# Patient Record
Sex: Male | Born: 1975 | Race: White | Hispanic: No | Marital: Married | Smoking: Never smoker
Health system: Southern US, Community
[De-identification: ages and names within clinical notes are randomized; demographics above are authoritative.]

## PROBLEM LIST (undated history)

## (undated) DIAGNOSIS — G44009 Cluster headache syndrome, unspecified, not intractable: Secondary | ICD-10-CM

## (undated) DIAGNOSIS — N200 Calculus of kidney: Secondary | ICD-10-CM

## (undated) HISTORY — DX: Cluster headache syndrome, unspecified, not intractable: G44.009

## (undated) HISTORY — PX: SPINE SURGERY: SHX786

## (undated) HISTORY — DX: Calculus of kidney: N20.0

## (undated) HISTORY — PX: THUMB ARTHROSCOPY: SHX2509

---

## 2001-12-26 ENCOUNTER — Ambulatory Visit (HOSPITAL_COMMUNITY): Admission: RE | Admit: 2001-12-26 | Discharge: 2001-12-27 | Payer: Self-pay | Admitting: Neurosurgery

## 2002-02-27 ENCOUNTER — Encounter: Admission: RE | Admit: 2002-02-27 | Discharge: 2002-05-20 | Payer: Self-pay | Admitting: Neurosurgery

## 2011-05-22 ENCOUNTER — Ambulatory Visit (INDEPENDENT_AMBULATORY_CARE_PROVIDER_SITE_OTHER): Payer: BC Managed Care – PPO

## 2011-05-22 DIAGNOSIS — J069 Acute upper respiratory infection, unspecified: Secondary | ICD-10-CM

## 2012-09-29 ENCOUNTER — Ambulatory Visit (INDEPENDENT_AMBULATORY_CARE_PROVIDER_SITE_OTHER): Payer: BC Managed Care – PPO | Admitting: Family Medicine

## 2012-09-29 ENCOUNTER — Ambulatory Visit: Payer: BC Managed Care – PPO

## 2012-09-29 VITALS — BP 116/69 | HR 61 | Temp 98.4°F | Resp 16 | Ht 76.5 in | Wt 218.6 lb

## 2012-09-29 DIAGNOSIS — Z87442 Personal history of urinary calculi: Secondary | ICD-10-CM

## 2012-09-29 DIAGNOSIS — R1031 Right lower quadrant pain: Secondary | ICD-10-CM

## 2012-09-29 DIAGNOSIS — N201 Calculus of ureter: Secondary | ICD-10-CM

## 2012-09-29 DIAGNOSIS — R1032 Left lower quadrant pain: Secondary | ICD-10-CM

## 2012-09-29 LAB — POCT URINALYSIS DIPSTICK
Bilirubin, UA: NEGATIVE
Glucose, UA: NEGATIVE
Ketones, UA: NEGATIVE
Nitrite, UA: NEGATIVE
Protein, UA: NEGATIVE
Spec Grav, UA: 1.01
Urobilinogen, UA: 0.2
pH, UA: 7

## 2012-09-29 LAB — POCT CBC
HCT, POC: 46.7 % (ref 43.5–53.7)
Hemoglobin: 14.7 g/dL (ref 14.1–18.1)
Lymph, poc: 2.3 (ref 0.6–3.4)
MCH, POC: 29.9 pg (ref 27–31.2)
MCHC: 31.5 g/dL — AB (ref 31.8–35.4)
MCV: 95.1 fL (ref 80–97)
MID (cbc): 0.5 (ref 0–0.9)
MPV: 13.4 fL (ref 0–99.8)
POC LYMPH PERCENT: 28.4 %L (ref 10–50)
POC MID %: 5.7 %M (ref 0–12)
Platelet Count, POC: 148 10*3/uL (ref 142–424)
RBC: 4.91 M/uL (ref 4.69–6.13)
RDW, POC: 13.3 %
WBC: 8.1 10*3/uL (ref 4.6–10.2)

## 2012-09-29 LAB — POCT UA - MICROSCOPIC ONLY
Bacteria, U Microscopic: NEGATIVE
Casts, Ur, LPF, POC: NEGATIVE
Crystals, Ur, HPF, POC: NEGATIVE
Epithelial cells, urine per micros: NEGATIVE
Mucus, UA: NEGATIVE
Yeast, UA: NEGATIVE

## 2012-09-29 MED ORDER — TAMSULOSIN HCL 0.4 MG PO CAPS: 0.4000 mg | ORAL_CAPSULE | Freq: Every day | ORAL | Status: DC

## 2012-09-29 NOTE — Patient Instructions (Addendum)
Take the Flomax 1 pill each night  Return in 5 days, sooner if worse  Strain urine  Drink lots of fluids

## 2012-09-29 NOTE — Progress Notes (Signed)
Subjective: 37 year old man who has a history of kidney stones in the past. For the last month he has been having low abdominal pain. It started one night and is continued off and on over the month. Sometimes bad sometimes is mild. No nausea vomiting. He has had mushy stools but not vomit diarrhea. He has not seen any blood in his stools. No difficulty with urination. No problems with sex. He lives under a lot of chronic stress. This is mostly from his job. He has not been having any fever. The symptoms have just continued to persist, and his mind is rolling on them, so he came on in.  Objective: Chest is clear. Heart regular without murmurs. Abdomen has normal bowel sounds, soft without masses. He has some suprapubic tenderness. No CVA tenderness.  Assessment: Nonspecific low abdominal pain History of kidney stones  Plan: CBC, UA, KUB  [facility] reading (PRIMARY) by  Dr. Alwyn Ren Possible right upj stone.    Results for orders placed in visit on 09/29/12  POCT CBC      Result Value Range   WBC 8.1  4.6 - 10.2 K/uL   Lymph, poc 2.3  0.6 - 3.4   POC LYMPH PERCENT 28.4  10 - 50 %L   MID (cbc) 0.5  0 - 0.9   POC MID % 5.7  0 - 12 %M   POC Granulocyte 5.3  2 - 6.9   Granulocyte percent 65.9  37 - 80 %G   RBC 4.91  4.69 - 6.13 M/uL   Hemoglobin 14.7  14.1 - 18.1 g/dL   HCT, POC 16.1  09.6 - 53.7 %   MCV 95.1  80 - 97 fL   MCH, POC 29.9  27 - 31.2 pg   MCHC 31.5 (*) 31.8 - 35.4 g/dL   RDW, POC 04.5     Platelet Count, POC 148  142 - 424 K/uL   MPV 13.4  0 - 99.8 fL  POCT UA - MICROSCOPIC ONLY      Result Value Range   WBC, Ur, HPF, POC 0-2     RBC, urine, microscopic 7-10     Bacteria, U Microscopic neg     Mucus, UA neg     Epithelial cells, urine per micros neg     Crystals, Ur, HPF, POC neg     Casts, Ur, LPF, POC neg     Yeast, UA neg    POCT URINALYSIS DIPSTICK      Result Value Range   Color, UA yellow     Clarity, UA clear     Glucose, UA neg     Bilirubin, UA neg     Ketones, UA neg     Spec Grav, UA 1.010     Blood, UA large     pH, UA 7.0     Protein, UA neg     Urobilinogen, UA 0.2     Nitrite, UA neg     Leukocytes, UA Trace     Assessment: Possible ureterolithiasis Low abdominal pain  Plan: Get radiology reading  Diagnosis: Possible bilateral ureterolithiasis  Plan: Flomax Return in 5 days

## 2016-10-13 ENCOUNTER — Telehealth: Payer: Self-pay | Admitting: Cardiology

## 2016-10-13 ENCOUNTER — Ambulatory Visit (INDEPENDENT_AMBULATORY_CARE_PROVIDER_SITE_OTHER): Payer: Managed Care, Other (non HMO) | Admitting: Cardiology

## 2016-10-13 ENCOUNTER — Ambulatory Visit (INDEPENDENT_AMBULATORY_CARE_PROVIDER_SITE_OTHER): Payer: Managed Care, Other (non HMO) | Admitting: Emergency Medicine

## 2016-10-13 ENCOUNTER — Encounter: Payer: Self-pay | Admitting: Emergency Medicine

## 2016-10-13 ENCOUNTER — Encounter: Payer: Self-pay | Admitting: Cardiology

## 2016-10-13 VITALS — BP 126/74 | HR 56 | Temp 97.8°F | Resp 18 | Ht 76.0 in | Wt 222.6 lb

## 2016-10-13 VITALS — BP 110/60 | HR 137 | Ht 76.0 in | Wt 226.0 lb

## 2016-10-13 DIAGNOSIS — I483 Typical atrial flutter: Secondary | ICD-10-CM

## 2016-10-13 DIAGNOSIS — R002 Palpitations: Secondary | ICD-10-CM | POA: Diagnosis not present

## 2016-10-13 DIAGNOSIS — I4891 Unspecified atrial fibrillation: Secondary | ICD-10-CM | POA: Diagnosis not present

## 2016-10-13 DIAGNOSIS — I4819 Other persistent atrial fibrillation: Secondary | ICD-10-CM | POA: Insufficient documentation

## 2016-10-13 DIAGNOSIS — I480 Paroxysmal atrial fibrillation: Secondary | ICD-10-CM | POA: Diagnosis not present

## 2016-10-13 DIAGNOSIS — Z0001 Encounter for general adult medical examination with abnormal findings: Secondary | ICD-10-CM | POA: Diagnosis not present

## 2016-10-13 NOTE — Patient Instructions (Addendum)
GO TO CARDIOLOGY DR [place] TODAY 10/13/16 415PM [address] BRING MEDS AND INSURANCE    IF you received an x-ray today, you will receive an invoice from [facility] Radiology. Please contact [facility] at [phone] with questions or concerns regarding your invoice.   IF you received labwork today, you will receive an invoice from [facility]. Please contact [facility] at [phone] with questions or concerns regarding your invoice.   Our billing staff will not be able to assist you with questions regarding bills from these companies.  You will be contacted with the lab results as soon as they are available. The fastest way to get your results is to activate your My Chart account. Instructions are located on the last page of this paperwork. If you have not heard from [place] regarding the results in 2 weeks, please contact this office.         Health Maintenance, Male A healthy lifestyle and preventive care is important for your health and wellness. Ask your health care provider about what schedule of regular examinations is right for you. What should I know about weight and diet?  Eat a Healthy Diet  Eat plenty of vegetables, fruits, whole grains, low-fat dairy products, and lean protein.  Do not eat a lot of foods high in solid fats, added sugars, or salt. Maintain a Healthy Weight  Regular exercise can help you achieve or maintain a healthy weight. You should:  Do at least 150 minutes of exercise each week. The exercise should increase your heart rate and make you sweat (moderate-intensity exercise).  Do strength-training exercises at least twice a week. Watch Your Levels of Cholesterol and Blood Lipids  Have your blood tested for lipids and cholesterol every 5 years starting at 41 years of age. If you are at high risk for heart disease, you should start having your blood tested when you are 41 years old. You may need to have your cholesterol levels checked more  often if:  Your lipid or cholesterol levels are high.  You are older than 41 years of age.  You are at high risk for heart disease. What should I know about cancer screening? Many types of cancers can be detected early and may often be prevented. Lung Cancer  You should be screened every year for lung cancer if:  You are a current smoker who has smoked for at least 30 years.  You are a former smoker who has quit within the past 15 years.  Talk to your health care provider about your screening options, when you should start screening, and how often you should be screened. Colorectal Cancer  Routine colorectal cancer screening usually begins at 41 years of age and should be repeated every 5-10 years until you are 41 years old. You may need to be screened more often if early forms of precancerous polyps or small growths are found. Your health care provider may recommend screening at an earlier age if you have risk factors for colon cancer.  Your health care provider may recommend using home test kits to check for hidden blood in the stool.  A small camera at the end of a tube can be used to examine your colon (sigmoidoscopy or colonoscopy). This checks for the earliest forms of colorectal cancer. Prostate and Testicular Cancer  Depending on your age and overall health, your health care provider may do certain tests to screen for prostate and testicular cancer.  Talk to your health care provider about any symptoms  or concerns you have about testicular or prostate cancer. Skin Cancer  Check your skin from head to toe regularly.  Tell your health care provider about any new moles or changes in moles, especially if:  There is a change in a mole's size, shape, or color.  You have a mole that is larger than a pencil eraser.  Always use sunscreen. Apply sunscreen liberally and repeat throughout the day.  Protect yourself by wearing long sleeves, pants, a wide-brimmed hat, and  sunglasses when outside. What should I know about heart disease, diabetes, and high blood pressure?  If you are 5918-41 years of age, have your blood pressure checked every 3-5 years. If you are 41 years of age or older, have your blood pressure checked every year. You should have your blood pressure measured twice-once when you are at a hospital or clinic, and once when you are not at a hospital or clinic. Record the average of the two measurements. To check your blood pressure when you are not at a hospital or clinic, you can use:  An automated blood pressure machine at a pharmacy.  A home blood pressure monitor.  Talk to your health care provider about your target blood pressure.  If you are between 2245-41 years old, ask your health care provider if you should take aspirin to prevent heart disease.  Have regular diabetes screenings by checking your fasting blood sugar level.  If you are at a normal weight and have a low risk for diabetes, have this test once every three years after the age of 41.  If you are overweight and have a high risk for diabetes, consider being tested at a younger age or more often.  A one-time screening for abdominal aortic aneurysm (AAA) by ultrasound is recommended for men aged 65-75 years who are current or former smokers. What should I know about preventing infection? Hepatitis B  If you have a higher risk for hepatitis B, you should be screened for this virus. Talk with your health care provider to find out if you are at risk for hepatitis B infection. Hepatitis C  Blood testing is recommended for:  Everyone born from [id number] through 1965.  Anyone with known risk factors for hepatitis C. Sexually Transmitted Diseases (STDs)  You should be screened each year for STDs including gonorrhea and chlamydia if:  You are sexually active and are younger than 41 years of age.  You are older than 41 years of age and your health care provider tells you that you are at  risk for this type of infection.  Your sexual activity has changed since you were last screened and you are at an increased risk for chlamydia or gonorrhea. Ask your health care provider if you are at risk.  Talk with your health care provider about whether you are at high risk of being infected with HIV. Your health care provider may recommend a prescription medicine to help prevent HIV infection. What else can I do?  Schedule regular health, dental, and eye exams.  Stay current with your vaccines (immunizations).  Do not use any tobacco products, such as cigarettes, chewing tobacco, and e-cigarettes. If you need help quitting, ask your health care provider.  Limit alcohol intake to no more than 2 drinks per day. One drink equals 12 ounces of beer, 5 ounces of wine, or 1 ounces of hard liquor.  Do not use street drugs.  Do not share needles.  Ask your health care provider for help  if you need support or information about quitting drugs.  Tell your health care provider if you often feel depressed.  Tell your health care provider if you have ever been abused or do not feel safe at home. This information is not intended to replace advice given to you by your health care provider. Make sure you discuss any questions you have with your health care provider. Document Released: 11/05/2007 Document Revised: 01/06/2016 Document Reviewed: 02/10/2015 Elsevier Interactive Patient Education  2017 [employer]  American Heart Association [facility]) Exercise Recommendation  Being physically active is important to prevent heart disease and stroke, the nation's No. 1and No. 5killers. To improve overall cardiovascular health, we suggest at least 150 minutes per week of moderate exercise or 75 minutes per week of vigorous exercise (or a combination of moderate and vigorous activity). Thirty minutes a day, five times a week is an easy goal to remember. You will also experience benefits even if you divide  your time into two or three segments of 10 to 15 minutes per day.  For people who would benefit from lowering their blood pressure or cholesterol, we recommend 40 minutes of aerobic exercise of moderate to vigorous intensity three to four times a week to lower the risk for heart attack and stroke.  Physical activity is anything that makes you move your body and burn calories.  This includes things like climbing stairs or playing sports. Aerobic exercises benefit your heart, and include walking, jogging, swimming or biking. Strength and stretching exercises are best for overall stamina and flexibility.  The simplest, positive change you can make to effectively improve your heart health is to start walking. It's enjoyable, free, easy, social and great exercise. A walking program is flexible and boasts high success rates because people can stick with it. It's easy for walking to become a regular and satisfying part of life.   For Overall Cardiovascular Health:  At least 30 minutes of moderate-intensity aerobic activity at least 5 days per week for a total of 150  OR   At least 25 minutes of vigorous aerobic activity at least 3 days per week for a total of 75 minutes; or a combination of moderate- and vigorous-intensity aerobic activity  AND   Moderate- to high-intensity muscle-strengthening activity at least 2 days per week for additional health benefits.  For Lowering Blood Pressure and Cholesterol  An average 40 minutes of moderate- to vigorous-intensity aerobic activity 3 or 4 times per week  What if I can't make it to the time goal? Something is always better than nothing! And everyone has to start somewhere. Even if you've been sedentary for years, today is the day you can begin to make healthy changes in your life. If you don't think you'll make it for 30 or 40 minutes, set a reachable goal for today. You can work up toward your overall goal by increasing your time as you get  stronger. Don't let all-or-nothing thinking rob you of doing what you can every day.  Source:http://www.heart.org    Atrial Fibrillation Atrial fibrillation is a type of heartbeat that is irregular or fast (rapid). If you have this condition, your heart keeps quivering in a weird (chaotic) way. This condition can make it so your heart cannot pump blood normally. Having this condition gives a person more risk for stroke, heart failure, and other heart problems. There are different types of atrial fibrillation. Talk with your doctor to learn about the type that you have. Follow these instructions  at home:  Take over-the-counter and prescription medicines only as told by your doctor.  If your doctor prescribed a blood-thinning medicine, take it exactly as told. Taking too much of it can cause bleeding. If you do not take enough of it, you will not have the protection that you need against stroke and other problems.  Do not use any tobacco products. These include cigarettes, chewing tobacco, and e-cigarettes. If you need help quitting, ask your doctor.  If you have apnea (obstructive sleep apnea), manage it as told by your doctor.  Do not drink alcohol.  Do not drink beverages that have caffeine. These include coffee, soda, and tea.  Maintain a healthy weight. Do not use diet pills unless your doctor says they are safe for you. Diet pills may make heart problems worse.  Follow diet instructions as told by your doctor.  Exercise regularly as told by your doctor.  Keep all follow-up visits as told by your doctor. This is important. Contact a doctor if:  You notice a change in the speed, rhythm, or strength of your heartbeat.  You are taking a blood-thinning medicine and you notice more bruising.  You get tired more easily when you move or exercise. Get help right away if:  You have pain in your chest or your belly (abdomen).  You have sweating or weakness.  You feel sick to your  stomach (nauseous).  You notice blood in your throw up (vomit), poop (stool), or pee (urine).  You are short of breath.  You suddenly have swollen feet and ankles.  You feel dizzy.  Your suddenly get weak or numb in your face, arms, or legs, especially if it happens on one side of your body.  You have trouble talking, trouble understanding, or both.  Your face or your eyelid droops on one side. These symptoms may be an emergency. Do not wait to see if the symptoms will go away. Get medical help right away. Call your local emergency services (911 in the [place] Do not drive yourself to the hospital. This information is not intended to replace advice given to you by your health care provider. Make sure you discuss any questions you have with your health care provider. Document Released: 02/16/2008 Document Revised: 10/15/2015 Document Reviewed: 09/03/2014 Elsevier Interactive Patient Education  2017 [employer].

## 2016-10-13 NOTE — Telephone Encounter (Signed)
Received a call from Clydie BraunKaren at Dr.Sagardia's office wanting to refer patient today to see Dr.Jordan for new onset Afib.Appointment scheduled with Dr.Jordan today at 4:40 pm.

## 2016-10-13 NOTE — Progress Notes (Signed)
David Hays 41 y.o.   Chief Complaint  Patient presents with  . Annual Exam    HISTORY OF PRESENT ILLNESS: This is a 41 y.o. male here for annual exam; has no complaints or medical concerns.  HPI   Prior to Admission medications   Medication Sig Start Date End Date Taking? Authorizing Provider  doxycycline (VIBRAMYCIN) 100 MG capsule Take 100 mg by mouth 2 (two) times daily.   Yes [provider]  MetroNIDAZOLE-Cleanser 0.75 % (Cream) KIT Apply topically.   Yes [provider]  tamsulosin (FLOMAX) 0.4 MG CAPS Take 1 capsule (0.4 mg total) by mouth daily after supper. Patient not taking: Reported on 10/13/2016 09/29/12   [name], MD    No Known Allergies  There are no active problems to display for this patient.   No past medical history on file.  Past Surgical History:  Procedure Laterality Date  . SPINE SURGERY      Social History   Social History  . Marital status: Single    Spouse name: N/A  . Number of children: N/A  . Years of education: N/A   Occupational History  . Not on file.   Social History Main Topics  . Smoking status: Never Smoker  . Smokeless tobacco: Never Used  . Alcohol use Yes  . Drug use: No  . Sexual activity: Yes   Other Topics Concern  . Not on file   Social History Narrative  . No narrative on file    No family history on file.   Review of Systems  Constitutional: Negative.  Negative for chills, fever and weight loss.  HENT: Negative.  Negative for congestion, hearing loss, nosebleeds and sore throat.   Eyes: Negative.  Negative for blurred vision, double vision and pain.  Respiratory: Negative for cough and shortness of breath.   Cardiovascular: Positive for palpitations. Negative for chest pain, orthopnea, leg swelling and PND.  Gastrointestinal: Negative for abdominal pain, diarrhea, nausea and vomiting.  Genitourinary: Negative for dysuria and hematuria.  Musculoskeletal: Negative for back  pain, myalgias and neck pain.  Skin: Negative for rash.  Neurological: Negative for dizziness, sensory change, speech change, focal weakness and headaches.  Endo/Heme/Allergies: Negative.   All other systems reviewed and are negative.  Vitals:   10/13/16 0934  BP: 126/74  Pulse: (!) 56  Resp: 18  Temp: 97.8 F (36.6 C)     Physical Exam  Constitutional: He is oriented to person, place, and time. He appears well-developed and well-nourished.  HENT:  Head: Normocephalic and atraumatic.  Eyes: Conjunctivae and EOM are normal. Pupils are equal, round, and reactive to light.  Neck: Normal range of motion. Neck supple.  Cardiovascular: An irregularly irregular rhythm present.  No murmur heard. Pulmonary/Chest: Effort normal and breath sounds normal.  Abdominal: Soft. Bowel sounds are normal. He exhibits no distension. There is no tenderness.  Musculoskeletal: Normal range of motion.  Neurological: He is alert and oriented to person, place, and time. No sensory deficit. He exhibits normal muscle tone.  Skin: Skin is warm and dry. Capillary refill takes less than 2 seconds. No rash noted.  Psychiatric: He has a normal mood and affect. His behavior is normal.  Vitals reviewed.  EKG: Atrial fibrillation with VR 80-100 No h/o prior A fib. Cardiology consult scheduled for today 415pm.  ASSESSMENT & PLAN: David Hays was seen today for annual exam.  Diagnoses and all orders for this visit:  New onset atrial fibrillation ([place])  Palpitations -  EKG 12-Lead  Encounter for general adult medical examination with abnormal findings -     CBC with Differential -     Comprehensive metabolic panel -     Hemoglobin A1c -     Lipid panel -     TSH    Patient Instructions   GO TO CARDIOLOGY DR [place] TODAY 10/13/16 415PM 9 [place] [address] BRING MEDS AND INSURANCE    IF you received an x-ray today, you will receive an invoice from [facility]. Please contact  [facility] at [phone] with questions or concerns regarding your invoice.   IF you received labwork today, you will receive an invoice from [facility]. Please contact [facility] at [phone] with questions or concerns regarding your invoice.   Our billing staff will not be able to assist you with questions regarding bills from these companies.  You will be contacted with the lab results as soon as they are available. The fastest way to get your results is to activate your My Chart account. Instructions are located on the last page of this paperwork. If you have not heard from [place] regarding the results in 2 weeks, please contact this office.         Health Maintenance, Male A healthy lifestyle and preventive care is important for your health and wellness. Ask your health care provider about what schedule of regular examinations is right for you. What should I know about weight and diet?  Eat a Healthy Diet  Eat plenty of vegetables, fruits, whole grains, low-fat dairy products, and lean protein.  Do not eat a lot of foods high in solid fats, added sugars, or salt. Maintain a Healthy Weight  Regular exercise can help you achieve or maintain a healthy weight. You should:  Do at least 150 minutes of exercise each week. The exercise should increase your heart rate and make you sweat (moderate-intensity exercise).  Do strength-training exercises at least twice a week. Watch Your Levels of Cholesterol and Blood Lipids  Have your blood tested for lipids and cholesterol every 5 years starting at 41 years of age. If you are at high risk for heart disease, you should start having your blood tested when you are 41 years old. You may need to have your cholesterol levels checked more often if:  Your lipid or cholesterol levels are high.  You are older than 41 years of age.  You are at high risk for heart disease. What should I know about cancer screening? Many types of cancers can  be detected early and may often be prevented. Lung Cancer  You should be screened every year for lung cancer if:  You are a current smoker who has smoked for at least 30 years.  You are a former smoker who has quit within the past 15 years.  Talk to your health care provider about your screening options, when you should start screening, and how often you should be screened. Colorectal Cancer  Routine colorectal cancer screening usually begins at 41 years of age and should be repeated every 5-10 years until you are 41 years old. You may need to be screened more often if early forms of precancerous polyps or small growths are found. Your health care provider may recommend screening at an earlier age if you have risk factors for colon cancer.  Your health care provider may recommend using home test kits to check for hidden blood in the stool.  A small camera at the end of a tube  can be used to examine your colon (sigmoidoscopy or colonoscopy). This checks for the earliest forms of colorectal cancer. Prostate and Testicular Cancer  Depending on your age and overall health, your health care provider may do certain tests to screen for prostate and testicular cancer.  Talk to your health care provider about any symptoms or concerns you have about testicular or prostate cancer. Skin Cancer  Check your skin from head to toe regularly.  Tell your health care provider about any new moles or changes in moles, especially if:  There is a change in a mole's size, shape, or color.  You have a mole that is larger than a pencil eraser.  Always use sunscreen. Apply sunscreen liberally and repeat throughout the day.  Protect yourself by wearing long sleeves, pants, a wide-brimmed hat, and sunglasses when outside. What should I know about heart disease, diabetes, and high blood pressure?  If you are 29-39 years of age, have your blood pressure checked every 3-5 years. If you are 95 years of age or  older, have your blood pressure checked every year. You should have your blood pressure measured twice-once when you are at a hospital or clinic, and once when you are not at a hospital or clinic. Record the average of the two measurements. To check your blood pressure when you are not at a hospital or clinic, you can use:  An automated blood pressure machine at a pharmacy.  A home blood pressure monitor.  Talk to your health care provider about your target blood pressure.  If you are between 59-19 years old, ask your health care provider if you should take aspirin to prevent heart disease.  Have regular diabetes screenings by checking your fasting blood sugar level.  If you are at a normal weight and have a low risk for diabetes, have this test once every three years after the age of 29.  If you are overweight and have a high risk for diabetes, consider being tested at a younger age or more often.  A one-time screening for abdominal aortic aneurysm (AAA) by ultrasound is recommended for men aged 11-75 years who are current or former smokers. What should I know about preventing infection? Hepatitis B  If you have a higher risk for hepatitis B, you should be screened for this virus. Talk with your health care provider to find out if you are at risk for hepatitis B infection. Hepatitis C  Blood testing is recommended for:  Everyone born from [birth year] through 1965.  Anyone with known risk factors for hepatitis C. Sexually Transmitted Diseases (STDs)  You should be screened each year for STDs including gonorrhea and chlamydia if:  You are sexually active and are younger than 41 years of age.  You are older than 41 years of age and your health care provider tells you that you are at risk for this type of infection.  Your sexual activity has changed since you were last screened and you are at an increased risk for chlamydia or gonorrhea. Ask your health care provider if you are at  risk.  Talk with your health care provider about whether you are at high risk of being infected with HIV. Your health care provider may recommend a prescription medicine to help prevent HIV infection. What else can I do?  Schedule regular health, dental, and eye exams.  Stay current with your vaccines (immunizations).  Do not use any tobacco products, such as cigarettes, chewing tobacco, and e-cigarettes. If you  need help quitting, ask your health care provider.  Limit alcohol intake to no more than 2 drinks per day. One drink equals 12 ounces of beer, 5 ounces of wine, or 1 ounces of hard liquor.  Do not use street drugs.  Do not share needles.  Ask your health care provider for help if you need support or information about quitting drugs.  Tell your health care provider if you often feel depressed.  Tell your health care provider if you have ever been abused or do not feel safe at home. This information is not intended to replace advice given to you by your health care provider. Make sure you discuss any questions you have with your health care provider. Document Released: 11/05/2007 Document Revised: 01/06/2016 Document Reviewed: 02/10/2015 Elsevier Interactive Patient Education  2017 [facility]) Exercise Recommendation  Being physically active is important to prevent heart disease and stroke, the nation's No. 1and No. 5killers. To improve overall cardiovascular health, we suggest at least 150 minutes per week of moderate exercise or 75 minutes per week of vigorous exercise (or a combination of moderate and vigorous activity). Thirty minutes a day, five times a week is an easy goal to remember. You will also experience benefits even if you divide your time into two or three segments of 10 to 15 minutes per day.  For people who would benefit from lowering their blood pressure or cholesterol, we recommend 40 minutes of aerobic exercise of moderate  to vigorous intensity three to four times a week to lower the risk for heart attack and stroke.  Physical activity is anything that makes you move your body and burn calories.  This includes things like climbing stairs or playing sports. Aerobic exercises benefit your heart, and include walking, jogging, swimming or biking. Strength and stretching exercises are best for overall stamina and flexibility.  The simplest, positive change you can make to effectively improve your heart health is to start walking. It's enjoyable, free, easy, social and great exercise. A walking program is flexible and boasts high success rates because people can stick with it. It's easy for walking to become a regular and satisfying part of life.   For Overall Cardiovascular Health:  At least 30 minutes of moderate-intensity aerobic activity at least 5 days per week for a total of 150  OR   At least 25 minutes of vigorous aerobic activity at least 3 days per week for a total of 75 minutes; or a combination of moderate- and vigorous-intensity aerobic activity  AND   Moderate- to high-intensity muscle-strengthening activity at least 2 days per week for additional health benefits.  For Lowering Blood Pressure and Cholesterol  An average 40 minutes of moderate- to vigorous-intensity aerobic activity 3 or 4 times per week  What if I can't make it to the time goal? Something is always better than nothing! And everyone has to start somewhere. Even if you've been sedentary for years, today is the day you can begin to make healthy changes in your life. If you don't think you'll make it for 30 or 40 minutes, set a reachable goal for today. You can work up toward your overall goal by increasing your time as you get stronger. Don't let all-or-nothing thinking rob you of doing what you can every day.  Source:http://www.heart.org    Atrial Fibrillation Atrial fibrillation is a type of heartbeat that is irregular or fast  (rapid). If you have this condition, your heart keeps  quivering in a weird (chaotic) way. This condition can make it so your heart cannot pump blood normally. Having this condition gives a person more risk for stroke, heart failure, and other heart problems. There are different types of atrial fibrillation. Talk with your doctor to learn about the type that you have. Follow these instructions at home:  Take over-the-counter and prescription medicines only as told by your doctor.  If your doctor prescribed a blood-thinning medicine, take it exactly as told. Taking too much of it can cause bleeding. If you do not take enough of it, you will not have the protection that you need against stroke and other problems.  Do not use any tobacco products. These include cigarettes, chewing tobacco, and e-cigarettes. If you need help quitting, ask your doctor.  If you have apnea (obstructive sleep apnea), manage it as told by your doctor.  Do not drink alcohol.  Do not drink beverages that have caffeine. These include coffee, soda, and tea.  Maintain a healthy weight. Do not use diet pills unless your doctor says they are safe for you. Diet pills may make heart problems worse.  Follow diet instructions as told by your doctor.  Exercise regularly as told by your doctor.  Keep all follow-up visits as told by your doctor. This is important. Contact a doctor if:  You notice a change in the speed, rhythm, or strength of your heartbeat.  You are taking a blood-thinning medicine and you notice more bruising.  You get tired more easily when you move or exercise. Get help right away if:  You have pain in your chest or your belly (abdomen).  You have sweating or weakness.  You feel sick to your stomach (nauseous).  You notice blood in your throw up (vomit), poop (stool), or pee (urine).  You are short of breath.  You suddenly have swollen feet and ankles.  You feel dizzy.  Your suddenly get weak  or numb in your face, arms, or legs, especially if it happens on one side of your body.  You have trouble talking, trouble understanding, or both.  Your face or your eyelid droops on one side. These symptoms may be an emergency. Do not wait to see if the symptoms will go away. Get medical help right away. Call your local emergency services (911 in the [place] Do not drive yourself to the hospital. This information is not intended to replace advice given to you by your health care provider. Make sure you discuss any questions you have with your health care provider. Document Released: 02/16/2008 Document Revised: 10/15/2015 Document Reviewed: 09/03/2014 Elsevier Interactive Patient Education  2017 Elsevier Inc.      Agustina Caroli, MD [facility]

## 2016-10-13 NOTE — Progress Notes (Signed)
Cardiology Office Note   Date:  10/13/2016   ID:  David Hays, DOB [birth year], MRN [id number]  PCP:  David Quint, MD  Cardiologist:   David Swaziland, MD   Chief Complaint  Patient presents with  . Atrial Fibrillation      History of Present Illness: David Hays is a 41 y.o. male seen at the request of Dr. Alvy Hays for evaluation of new onset Atrial fibrillation. The patient reports that he just went for a routine physical today and was found to have atrial fibrillation. Rate was 85 bpm. He was not aware of arrhythmia. In retrospect may have noted a little pounding in his heart at rest. No tachycardia, chest pain, SOB, dizziness, or syncope. Denies history of HTN, DM, HLD. States he was put on Tenormin as 41 year old for murmur but thinks he only took it for one year. 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 Finds little times to care for himself or exercise. Has trouble sleeping and will sometimes get up at 3 am because his mind is racing. Drinks a moderate amount of caffeine. Rarely drinks Etoh. No smoking or recreational drugs. No decongestants.     Past Medical History:  Diagnosis Date  . Headache, cluster   . Kidney stone     Past Surgical History:  Procedure Laterality Date  . SPINE SURGERY    . THUMB ARTHROSCOPY       Current Outpatient Prescriptions  Medication Sig Dispense Refill  . doxycycline (VIBRAMYCIN) 100 MG capsule Take 100 mg by mouth 2 (two) times daily.    . metroNIDAZOLE (METROCREAM) 0.75 % cream Apply 1 application topically 2 (two) times daily.     No current facility-administered medications for this visit.     Allergies:   Patient has no known allergies.    Social History:  The patient  reports that he has never smoked. He has never used smokeless tobacco. He reports that he drinks alcohol. He reports that he does not use drugs.   Family History:  The patient's family history includes Arrhythmia in his mother;  Hyperlipidemia in his father.    ROS:  Please see the history of present illness.   Otherwise, review of systems are positive for none.   All other systems are reviewed and negative.    PHYSICAL EXAM: VS:  BP 110/60   Pulse (!) 137   Ht 6\' 4"  (1.93 m)   Wt 226 lb (102.5 kg)   BMI 27.51 kg/m  , BMI Body mass index is 27.51 kg/m. GEN: Well nourished, well developed, in no acute distress  HEENT: normal  Neck: no JVD, carotid bruits, or masses Cardiac: RRR; no murmurs, rubs, or gallops,no edema  Respiratory:  clear to auscultation bilaterally, normal work of breathing GI: soft, nontender, nondistended, + BS MS: no deformity or atrophy  Skin: warm and dry, no rash Neuro:  Strength and sensation are intact Psych: euthymic mood, full affect   EKG:  EKG is ordered today. The ekg ordered today at 10:08 am demonstrates Atrial fibrillation with rate 85. Incomplete RBBB.   Repeat Ecg in our office shows atrial flutter with rate 137 bpm. Nonspecific ST abnormality.   Repeat Ecg after exam showed NSR with rate 59 bpm. Normal Ecg.   I have personally reviewed and interpreted these studies.    Recent Labs: No results found for requested labs within last 8760 hours.    Lipid Panel No results found  for: CHOL, TRIG, HDL, CHOLHDL, VLDL, LDLCALC, LDLDIRECT    Wt Readings from Last 3 Encounters:  10/13/16 226 lb (102.5 kg)  10/13/16 222 lb 9.6 oz (101 kg)  09/29/12 218 lb 9.6 oz (99.2 kg)      Other studies Reviewed: Additional studies/ records that were reviewed today include: none   ASSESSMENT AND PLAN:  1.  Paroxysmal Atrial fibrillation and flutter. Asymptomatic. In and out of rhythm on exam. No clear triggers but caffeine and lifestyle may be playing a role. Appropriate lab work drawn earlier today by Dr. Alvy BimlerSagardia. Will schedule for an Echocardiogram. Recommend avoidance of stimulants such as caffeine or decongestants. Try and improve on a heart healthy lifestyle with  reduced stress, increased aerobic activity and plenty of rest.  Since he is asymptomatic and rhythm is not sustained I don't think he needs AV nodal blockade or antiarrhythmic drug therapy at this point.   Current medicines are reviewed at length with the patient today.  The patient does not have concerns regarding medicines.  The following changes have been made:  no change  Labs/ tests ordered today include: Echo  Orders Placed This Encounter  Procedures  . EKG 12-Lead  . EKG 12-Lead  . ECHOCARDIOGRAM COMPLETE     Disposition:   FU with me in 4 weeks  Signed, David SwazilandJordan, MD  10/13/2016 5:59 PM    [facility] [address], [place], [place], [id number] Phone [phone], Fax [phone]

## 2016-10-13 NOTE — Patient Instructions (Signed)
We will schedule you for an Echocardiogram  Avoid cardiac stimulants like caffeine and decongestants.  I will see you back after the above study

## 2016-10-14 ENCOUNTER — Encounter: Payer: Self-pay | Admitting: Radiology

## 2016-10-14 LAB — COMPREHENSIVE METABOLIC PANEL
ALBUMIN: 4.6 g/dL (ref 3.5–5.5)
ALK PHOS: 63 IU/L (ref 39–117)
ALT: 17 IU/L (ref 0–44)
AST: 18 IU/L (ref 0–40)
Albumin/Globulin Ratio: 1.6 (ref 1.2–2.2)
BUN / CREAT RATIO: 14 (ref 9–20)
BUN: 10 mg/dL (ref 6–24)
Bilirubin Total: 0.5 mg/dL (ref 0.0–1.2)
CHLORIDE: 102 mmol/L (ref 96–106)
CO2: 24 mmol/L (ref 18–29)
CREATININE: 0.7 mg/dL — AB (ref 0.76–1.27)
Calcium: 9.4 mg/dL (ref 8.7–10.2)
GFR calc Af Amer: 136 mL/min/{1.73_m2} (ref >59)
GFR calc non Af Amer: 118 mL/min/{1.73_m2} (ref >59)
GLUCOSE: 88 mg/dL (ref 65–99)
Globulin, Total: 2.8 g/dL (ref 1.5–4.5)
Potassium: 4.4 mmol/L (ref 3.5–5.2)
Sodium: 142 mmol/L (ref 134–144)
TOTAL PROTEIN: 7.4 g/dL (ref 6.0–8.5)

## 2016-10-14 LAB — CBC WITH DIFFERENTIAL/PLATELET
Basophils Absolute: 0 10*3/uL (ref 0.0–0.2)
Basos: 0 %
EOS (ABSOLUTE): 0.1 10*3/uL (ref 0.0–0.4)
Eos: 1 %
HEMOGLOBIN: 14.2 g/dL (ref 13.0–17.7)
Hematocrit: 43.2 % (ref 37.5–51.0)
IMMATURE GRANS (ABS): 0 10*3/uL (ref 0.0–0.1)
Immature Granulocytes: 0 %
LYMPHS: 34 %
Lymphocytes Absolute: 2.2 10*3/uL (ref 0.7–3.1)
MCH: 29.2 pg (ref 26.6–33.0)
MCHC: 32.9 g/dL (ref 31.5–35.7)
MCV: 89 fL (ref 79–97)
MONOCYTES: 7 %
Monocytes Absolute: 0.5 10*3/uL (ref 0.1–0.9)
Neutrophils Absolute: 3.6 10*3/uL (ref 1.4–7.0)
Neutrophils: 58 %
PLATELETS: 163 10*3/uL (ref 150–379)
RBC: 4.87 x10E6/uL (ref 4.14–5.80)
RDW: 13.7 % (ref 12.3–15.4)
WBC: 6.3 10*3/uL (ref 3.4–10.8)

## 2016-10-14 LAB — LIPID PANEL
CHOLESTEROL TOTAL: 155 mg/dL (ref 100–199)
Chol/HDL Ratio: 3.8 ratio (ref 0.0–5.0)
HDL: 41 mg/dL (ref >39)
LDL Calculated: 105 mg/dL — ABNORMAL HIGH (ref 0–99)
TRIGLYCERIDES: 44 mg/dL (ref 0–149)
VLDL Cholesterol Cal: 9 mg/dL (ref 5–40)

## 2016-10-14 LAB — HEMOGLOBIN A1C
Est. average glucose Bld gHb Est-mCnc: 100 mg/dL
HEMOGLOBIN A1C: 5.1 % (ref 4.8–5.6)

## 2016-10-14 LAB — TSH: TSH: 1.31 u[IU]/mL (ref 0.450–4.500)

## 2016-10-27 ENCOUNTER — Ambulatory Visit (HOSPITAL_COMMUNITY): Payer: Managed Care, Other (non HMO) | Attending: Cardiology

## 2016-10-27 ENCOUNTER — Other Ambulatory Visit: Payer: Self-pay

## 2016-10-27 DIAGNOSIS — I480 Paroxysmal atrial fibrillation: Secondary | ICD-10-CM | POA: Diagnosis not present

## 2016-11-08 NOTE — Progress Notes (Signed)
Cardiology Office Note   Date:  11/10/2016   ID:  David Hays, DOB Feb 21, 1976, MRN [id number]  PCP:  David Hays, David Jose, MD  Cardiologist:   David Sayed Swazil[place], MD   Chief Complaint  Patient presents with  . Follow-up    F/U after echo. No complaints.      History of Present Illness: David JamesKenneth J Hays is a 41 y.o. male seen for follow up  Atrial fibrillation. He was found on routine physical to  have atrial fibrillation. Rate was 85 bpm. When we saw for initial evaluation he was found to have Atrial flutter with rate 137, then Afib with rate 85, then converted to NSR with rate in 60s.He was not aware of arrhythmia. In retrospect may have noted a little pounding in his heart at rest. No tachycardia, chest pain, SOB, dizziness, or syncope. Denies history of HTN, DM, HLD.  Since his diagnosis he has changed his lifestyle and is exercising daily. Avoiding caffeine. Eating much less sugar. Feels very well.  Rarely drinks Etoh. No smoking or recreational drugs. No decongestants.    Past Medical History:  Diagnosis Date  . Headache, cluster   . Kidney stone     Past Surgical History:  Procedure Laterality Date  . SPINE SURGERY    . THUMB ARTHROSCOPY       Current Outpatient Prescriptions  Medication Sig Dispense Refill  . doxycycline (VIBRAMYCIN) 100 MG capsule Take 100 mg by mouth 2 (two) times daily.    . metroNIDAZOLE (METROCREAM) 0.75 % cream Apply 1 application topically 2 (two) times daily.     No current facility-administered medications for this visit.     Allergies:   Patient has no known allergies.    Social History:  The patient  reports that he has never smoked. He has never used smokeless tobacco. He reports that he drinks alcohol. He reports that he does not use drugs.   Family History:  The patient's family history includes Arrhythmia in his mother; Hyperlipidemia in his father.    ROS:  Please see the history of present illness.   Otherwise, review of  systems are positive for none.   All other systems are reviewed and negative.    PHYSICAL EXAM: VS:  BP 114/76   Pulse (!) 58   Ht 6\' 4"  (1.93 m)   Wt 225 lb (102.1 kg)   BMI 27.39 kg/m  , BMI Body mass index is 27.39 kg/m. GEN: Well nourished, well developed, in no acute distress  HEENT: normal  Neck: no JVD, carotid bruits, or masses Cardiac: RRR; no murmurs, rubs, or gallops,no edema  Respiratory:  clear to auscultation bilaterally, normal work of breathing GI: soft, nontender, nondistended, + BS MS: no deformity or atrophy  Skin: warm and dry, no rash Neuro:  Strength and sensation are intact Psych: euthymic mood, full affect   Recent Labs: 10/13/2016: ALT 17; BUN 10; Creatinine, Ser 0.70; Hemoglobin 14.2; Platelets 163; Potassium 4.4; Sodium 142; TSH 1.310    Lipid Panel    Component Value Date/Time   CHOL 155 10/13/2016 1001   TRIG 44 10/13/2016 1001   HDL 41 10/13/2016 1001   CHOLHDL 3.8 10/13/2016 1001   LDLCALC 105 (H) 10/13/2016 1001      Wt Readings from Last 3 Encounters:  11/10/16 225 lb (102.1 kg)  10/13/16 226 lb (102.5 kg)  10/13/16 222 lb 9.6 oz (101 kg)      Other studies Reviewed: Echo 10/27/16: Study Conclusions  -  Left ventricle: The cavity size was mildly dilated. Systolic   function was normal. The estimated ejection fraction was in the   range of 55% to 60%. Wall motion was normal; there were no   regional wall motion abnormalities. Left ventricular diastolic   function parameters were normal. - Aorta: Maximal diameter across sinuses of Valsalva diameter: 39   mm (ED). - Aortic root: The aortic root was mildly dilated. - Atrial septum: There was increased thickness of the septum,   consistent with lipomatous hypertrophy. - Pulmonary arteries: Systolic pressure could not be accurately   estimated.   ASSESSMENT AND PLAN:  1.  Paroxysmal Atrial fibrillation and flutter. Asymptomatic. No sustained arrhythmia. No clear triggers but  caffeine and lifestyle may have been playing a role. Echo is normal. Labs ok. [place] Vasc score of 0 so anticoagulation not recommended.  Recommend continued avoidance of stimulants such as caffeine or decongestants. Continue other lifestyle modification. Since he is asymptomatic and rhythm is not sustained I don't think he needs additional therapy. I will follow up in 6 months.   No orders of the defined types were placed in this encounter.    Disposition:   FU with me in 6 months.  Signed, Indira Sorenson [place], MD  11/10/2016 11:48 AM    [facility] [address] [place], [place], [address] Phone [phone], Fax [phone]

## 2016-11-10 ENCOUNTER — Encounter: Payer: Self-pay | Admitting: Cardiology

## 2016-11-10 ENCOUNTER — Ambulatory Visit (INDEPENDENT_AMBULATORY_CARE_PROVIDER_SITE_OTHER): Payer: Managed Care, Other (non HMO) | Admitting: Cardiology

## 2016-11-10 VITALS — BP 114/76 | HR 58 | Ht 76.0 in | Wt 225.0 lb

## 2016-11-10 DIAGNOSIS — I480 Paroxysmal atrial fibrillation: Secondary | ICD-10-CM | POA: Diagnosis not present

## 2016-11-10 DIAGNOSIS — I483 Typical atrial flutter: Secondary | ICD-10-CM | POA: Diagnosis not present

## 2016-11-10 NOTE — Patient Instructions (Signed)
Continue your lifestyle modification  I will see you in 6 months

## 2016-12-12 ENCOUNTER — Encounter: Payer: Self-pay | Admitting: Emergency Medicine

## 2016-12-12 ENCOUNTER — Ambulatory Visit (INDEPENDENT_AMBULATORY_CARE_PROVIDER_SITE_OTHER): Payer: Managed Care, Other (non HMO) | Admitting: Emergency Medicine

## 2016-12-12 ENCOUNTER — Ambulatory Visit (INDEPENDENT_AMBULATORY_CARE_PROVIDER_SITE_OTHER): Payer: Managed Care, Other (non HMO)

## 2016-12-12 VITALS — BP 103/62 | HR 58 | Temp 98.4°F | Resp 16 | Ht 76.0 in | Wt 221.0 lb

## 2016-12-12 DIAGNOSIS — M79672 Pain in left foot: Secondary | ICD-10-CM

## 2016-12-12 DIAGNOSIS — S6722XA Crushing injury of left hand, initial encounter: Secondary | ICD-10-CM | POA: Diagnosis not present

## 2016-12-12 NOTE — Progress Notes (Signed)
David Hays 41 y.o.   Chief Complaint  Patient presents with  . Hand Injury    LEFT 5th finger on 12/12/16  . Foot Swelling    LEFT swelling x 2 1/2 weeks    HISTORY OF PRESENT ILLNESS: This is a 41 y.o. male complaining of left hand injury sustained today and left foot pain x 2 weeks.  HPI   Prior to Admission medications   Medication Sig Start Date End Date Taking? Authorizing Provider  doxycycline (VIBRAMYCIN) 100 MG capsule Take 100 mg by mouth 2 (two) times daily.   Yes [provider]  metroNIDAZOLE (METROCREAM) 0.75 % cream Apply 1 application topically 2 (two) times daily.   Yes [provider]    No Known Allergies  Patient Active Problem List   Diagnosis Date Noted  . Encounter for general adult medical examination with abnormal findings 10/13/2016  . New onset atrial fibrillation (HCC) 10/13/2016  . Palpitations 10/13/2016    Past Medical History:  Diagnosis Date  . Headache, cluster   . Kidney stone     Past Surgical History:  Procedure Laterality Date  . SPINE SURGERY    . THUMB ARTHROSCOPY      Social History   Social History  . Marital status: Single    Spouse name: N/A  . Number of children: 0  . Years of education: N/A   Occupational History  . [employer].    Social History Main Topics  . Smoking status: Never Smoker  . Smokeless tobacco: Never Used  . Alcohol use Yes  . Drug use: No  . Sexual activity: Yes   Other Topics Concern  . Not on file   Social History Narrative  . No narrative on file    Family History  Problem Relation Age of Onset  . Arrhythmia Mother   . Hyperlipidemia Father      Review of Systems  Constitutional: Negative for chills and fever.  Respiratory: Negative for shortness of breath.   Cardiovascular: Negative for palpitations.  Gastrointestinal: Negative for vomiting.  Musculoskeletal:       Left hand and foot pain  Skin: Negative for rash.  Neurological: Negative  for dizziness and headaches.  All other systems reviewed and are negative.  Vitals:   12/12/16 1712  BP: 103/62  Pulse: (!) 58  Resp: 16  Temp: 98.4 F (36.9 C)    Physical Exam  Constitutional: He is oriented to person, place, and time. He appears well-developed and well-nourished.  HENT:  Head: Normocephalic and atraumatic.  Eyes: Pupils are equal, round, and reactive to light. EOM are normal.  Neck: Normal range of motion. Neck supple.  Cardiovascular: Normal rate and regular rhythm.   Pulmonary/Chest: Effort normal.  Musculoskeletal:  Left hand: +bruising and swelling to 4th and 5th fingers; FROM Left foot: mild calcaneal tenderness to deep palpation; NVI with FROM.  Neurological: He is alert and oriented to person, place, and time.  Skin: Skin is warm and dry. Capillary refill takes less than 2 seconds. No rash noted.  Psychiatric: He has a normal mood and affect. His behavior is normal.  Vitals reviewed.    ASSESSMENT & PLAN: David Hays was seen today for hand injury and foot swelling.  Diagnoses and all orders for this visit:  Crushing injury of left hand, initial encounter -     DG Hand Complete Left; Future  Left foot pain -     DG Foot Complete Left; Future  Patient Instructions       IF you received an x-ray today, you will receive an invoice from [facility]. Please contact [facility] at [phone] with questions or concerns regarding your invoice.   IF you received labwork today, you will receive an invoice from [place]. Please contact [facility] at [phone] with questions or concerns regarding your invoice.   Our billing staff will not be able to assist you with questions regarding bills from these companies.  You will be contacted with the lab results as soon as they are available. The fastest way to get your results is to activate your My Chart account. Instructions are located on the last page of this paperwork. If you  have not heard from [place] regarding the results in 2 weeks, please contact this office.     Crush Injury of the Hand A crush injury of the hand happens when a great amount of force is suddenly applied to your hand. For example, this might happen if a heavy load falls on your hand. This injury can damage your skin and many parts (structures) in the hand and wrist joint. Treatment will depend on which parts are damaged and how severe your injury is. Follow these instructions at home: If you have a splint:  Wear the splint as told by your doctor. Remove it only as told by your doctor.  Loosen the splint if your fingers tingle, get numb, or turn cold and blue.  Do not let your splint get wet if it is not waterproof.  Keep the splint clean. Wound care   If you have any skin wounds that were covered with bandages (dressings), follow instructions from your doctor about how to take care of your wounds. Make sure you: ? Wash your hands with soap and water before you change your bandage. If you cannot use soap and water, use hand sanitizer. ? Change your bandage as told by your doctor. ? Leave stitches (sutures), skin glue, or skin tape (adhesive) strips in place. They may need to stay in place for 2 weeks or longer. If tape strips get loose and curl up, you may trim the loose edges. Do not remove tape strips completely unless your doctor says it is okay.  If you have skin wounds, check them every day for signs of infection. Check for: ? More redness, swelling, or pain. ? More fluid or blood. ? Warmth. ? Pus or a bad smell. Managing pain, stiffness, and swelling  If directed, put ice on the injured area. ? Put ice in a plastic bag. ? Place a towel between your skin and the bag. ? Leave the ice on for 20 minutes, 2-3 times a day.  Raise (elevate) the injured area above the level of your heart while you are sitting or lying down. Driving  Do not drive or use heavy machinery while taking  prescription pain medicine.  Ask your doctor when it is safe to drive if you have a splint on your hand or arm. Activity  Return to your normal activities as told by your doctor. Ask your doctor what activities are safe for you.  Work with a physical therapist (PT) or occupational therapist (OT) as told by your doctor. General instructions  Do not put pressure on any part of the splint until it is fully hardened. This may take many hours.  If you have a splint and it is not waterproof, cover it with a watertight plastic bag when you take a bath or  a shower.  Take over-the-counter and prescription medicines only as told by your doctor.  If you were prescribed an antibiotic, take it as told by your doctor. Do not stop taking the antibiotic before the prescription is done.  Do not use any tobacco products, such as cigarettes, chewing tobacco, and e-cigarettes. If you need help quitting, ask your doctor.  Keep all follow-up visits as told by your doctor. This is important. These include PT and OT visits. Contact a doctor if:  A wound with stitches opens up.  You have more redness, swelling, or pain in your hand.  You have more fluid or blood coming from your hand.  Your hand feels warm to the touch.  You have pus or a bad smell coming from your hand.  You have a fever. Get help right away if:  You suddenly have very bad pain in your hand.  You had feeling (sensation) in your hand before but you suddenly lose feeling.  Your wrist or hand becomes bent (contracted) without you trying to bend it.  Your symptoms had gotten better and they suddenly get worse.  Your hand or fingers are turning pink or blue. This information is not intended to replace advice given to you by your health care provider. Make sure you discuss any questions you have with your health care provider. Document Released: 10/27/2009 Document Revised: 10/15/2015 Document Reviewed: 12/31/2014 Elsevier  Interactive Patient Education  2018 Elsevier Inc.      Edwina BarthMiguel Merrisa Skorupski, MD [facility]

## 2016-12-12 NOTE — Patient Instructions (Addendum)
IF you received an x-ray today, you will receive an invoice from [facility]. Please contact [facility] at [phone] with questions or concerns regarding your invoice.   IF you received labwork today, you will receive an invoice from [facility]. Please contact [facility] at [phone] with questions or concerns regarding your invoice.   Our billing staff will not be able to assist you with questions regarding bills from these companies.  You will be contacted with the lab results as soon as they are available. The fastest way to get your results is to activate your My Chart account. Instructions are located on the last page of this paperwork. If you have not heard from us regarding the results in 2 weeks, please contact this office.     Crush Injury of the Hand A crush injury of the hand happens when a great amount of force is suddenly applied to your hand. For example, this might happen if a heavy load falls on your hand. This injury can damage your skin and many parts (structures) in the hand and wrist joint. Treatment will depend on which parts are damaged and how severe your injury is. Follow these instructions at home: If you have a splint:  Wear the splint as told by your doctor. Remove it only as told by your doctor.  Loosen the splint if your fingers tingle, get numb, or turn cold and blue.  Do not let your splint get wet if it is not waterproof.  Keep the splint clean. Wound care   If you have any skin wounds that were covered with bandages (dressings), follow instructions from your doctor about how to take care of your wounds. Make sure you: ? Wash your hands with soap and water before you change your bandage. If you cannot use soap and water, use hand sanitizer. ? Change your bandage as told by your doctor. ? Leave stitches (sutures), skin glue, or skin tape (adhesive) strips in place. They may need to stay in place for 2 weeks or longer. If tape  strips get loose and curl up, you may trim the loose edges. Do not remove tape strips completely unless your doctor says it is okay.  If you have skin wounds, check them every day for signs of infection. Check for: ? More redness, swelling, or pain. ? More fluid or blood. ? Warmth. ? Pus or a bad smell. Managing pain, stiffness, and swelling  If directed, put ice on the injured area. ? Put ice in a plastic bag. ? Place a towel between your skin and the bag. ? Leave the ice on for 20 minutes, 2-3 times a day.  Raise (elevate) the injured area above the level of your heart while you are sitting or lying down. Driving  Do not drive or use heavy machinery while taking prescription pain medicine.  Ask your doctor when it is safe to drive if you have a splint on your hand or arm. Activity  Return to your normal activities as told by your doctor. Ask your doctor what activities are safe for you.  Work with a physical therapist (PT) or occupational therapist (OT) as told by your doctor. General instructions  Do not put pressure on any part of the splint until it is fully hardened. This may take many hours.  If you have a splint and it is not waterproof, cover it with a watertight plastic bag when you take a bath or a shower.  Take over-the-counter and  prescription medicines only as told by your doctor.  If you were prescribed an antibiotic, take it as told by your doctor. Do not stop taking the antibiotic before the prescription is done.  Do not use any tobacco products, such as cigarettes, chewing tobacco, and e-cigarettes. If you need help quitting, ask your doctor.  Keep all follow-up visits as told by your doctor. This is important. These include PT and OT visits. Contact a doctor if:  A wound with stitches opens up.  You have more redness, swelling, or pain in your hand.  You have more fluid or blood coming from your hand.  Your hand feels warm to the touch.  You have pus  or a bad smell coming from your hand.  You have a fever. Get help right away if:  You suddenly have very bad pain in your hand.  You had feeling (sensation) in your hand before but you suddenly lose feeling.  Your wrist or hand becomes bent (contracted) without you trying to bend it.  Your symptoms had gotten better and they suddenly get worse.  Your hand or fingers are turning pink or blue. This information is not intended to replace advice given to you by your health care provider. Make sure you discuss any questions you have with your health care provider. Document Released: 10/27/2009 Document Revised: 10/15/2015 Document Reviewed: 12/31/2014 Elsevier Interactive Patient Education  [employer]2018 Elsevier Inc.

## 2018-01-01 ENCOUNTER — Telehealth: Payer: Self-pay | Admitting: Emergency Medicine

## 2018-01-01 NOTE — Telephone Encounter (Signed)
Copied from CRM [id number]. Topic: General - Other >> Jan 01, 2018 12:55 PM Marylen PontoMcneil, Ja-Kwan wrote: Reason for CRM: Pt states he and his wife are expecting a child in October and he was told he needed to have the T-dap vaccine. Pt requests call back. Cb# [phone]

## 2018-01-04 NOTE — Telephone Encounter (Signed)
Spoke with pt an advised if he has not had a tdap in the last 10 yrs he will need one before the baby is born as well as a flu vaccine.  Pt hasn't had PE since 10/13/16 so PE is need as well.  Advised will vaccinate during PE , pt agreeable [facility]. Dgaddy, CMA

## 2018-01-15 ENCOUNTER — Other Ambulatory Visit: Payer: Self-pay

## 2018-01-15 ENCOUNTER — Encounter: Payer: Self-pay | Admitting: Emergency Medicine

## 2018-01-15 ENCOUNTER — Ambulatory Visit (INDEPENDENT_AMBULATORY_CARE_PROVIDER_SITE_OTHER): Payer: Managed Care, Other (non HMO) | Admitting: Emergency Medicine

## 2018-01-15 VITALS — BP 111/71 | HR 89 | Temp 98.2°F | Resp 16 | Ht 76.0 in | Wt 216.0 lb

## 2018-01-15 DIAGNOSIS — Z23 Encounter for immunization: Secondary | ICD-10-CM | POA: Diagnosis not present

## 2018-01-15 DIAGNOSIS — I480 Paroxysmal atrial fibrillation: Secondary | ICD-10-CM | POA: Diagnosis not present

## 2018-01-15 DIAGNOSIS — Z0000 Encounter for general adult medical examination without abnormal findings: Secondary | ICD-10-CM

## 2018-01-15 NOTE — Patient Instructions (Addendum)
   If you have lab work done today you will be contacted with your lab results within the next 2 weeks.  If you have not heard from us then please contact us. The fastest way to get your results is to register for My Chart.   IF you received an x-ray today, you will receive an invoice from [facility]. Please contact [facility] at [phone] with questions or concerns regarding your invoice.   IF you received labwork today, you will receive an invoice from [facility]. Please contact [facility] at [phone] with questions or concerns regarding your invoice.   Our billing staff will not be able to assist you with questions regarding bills from these companies.  You will be contacted with the lab results as soon as they are available. The fastest way to get your results is to activate your My Chart account. Instructions are located on the last page of this paperwork. If you have not heard from us regarding the results in 2 weeks, please contact this office.      Health Maintenance, Male A healthy lifestyle and preventive care is important for your health and wellness. Ask your health care provider about what schedule of regular examinations is right for you. What should I know about weight and diet? Eat a Healthy Diet  Eat plenty of vegetables, fruits, whole grains, low-fat dairy products, and lean protein.  Do not eat a lot of foods high in solid fats, added sugars, or salt.  Maintain a Healthy Weight Regular exercise can help you achieve or maintain a healthy weight. You should:  Do at least 150 minutes of exercise each week. The exercise should increase your heart rate and make you sweat (moderate-intensity exercise).  Do strength-training exercises at least twice a week.  Watch Your Levels of Cholesterol and Blood Lipids  Have your blood tested for lipids and cholesterol every 5 years starting at 42 years of age. If you are at high risk for heart disease, you  should start having your blood tested when you are 42 years old. You may need to have your cholesterol levels checked more often if: ? Your lipid or cholesterol levels are high. ? You are older than 42 years of age. ? You are at high risk for heart disease.  What should I know about cancer screening? Many types of cancers can be detected early and may often be prevented. Lung Cancer  You should be screened every year for lung cancer if: ? You are a current smoker who has smoked for at least 30 years. ? You are a former smoker who has quit within the past 15 years.  Talk to your health care provider about your screening options, when you should start screening, and how often you should be screened.  Colorectal Cancer  Routine colorectal cancer screening usually begins at 42 years of age and should be repeated every 5-10 years until you are 42 years old. You may need to be screened more often if early forms of precancerous polyps or small growths are found. Your health care provider may recommend screening at an earlier age if you have risk factors for colon cancer.  Your health care provider may recommend using home test kits to check for hidden blood in the stool.  A small camera at the end of a tube can be used to examine your colon (sigmoidoscopy or colonoscopy). This checks for the earliest forms of colorectal cancer.  Prostate and Testicular Cancer  Depending   on your age and overall health, your health care provider may do certain tests to screen for prostate and testicular cancer.  Talk to your health care provider about any symptoms or concerns you have about testicular or prostate cancer.  Skin Cancer  Check your skin from head to toe regularly.  Tell your health care provider about any new moles or changes in moles, especially if: ? There is a change in a mole's size, shape, or color. ? You have a mole that is larger than a pencil eraser.  Always use sunscreen. Apply  sunscreen liberally and repeat throughout the day.  Protect yourself by wearing long sleeves, pants, a wide-brimmed hat, and sunglasses when outside.  What should I know about heart disease, diabetes, and high blood pressure?  If you are 18-39 years of age, have your blood pressure checked every 3-5 years. If you are 40 years of age or older, have your blood pressure checked every year. You should have your blood pressure measured twice-once when you are at a hospital or clinic, and once when you are not at a hospital or clinic. Record the average of the two measurements. To check your blood pressure when you are not at a hospital or clinic, you can use: ? An automated blood pressure machine at a pharmacy. ? A home blood pressure monitor.  Talk to your health care provider about your target blood pressure.  If you are between 45-79 years old, ask your health care provider if you should take aspirin to prevent heart disease.  Have regular diabetes screenings by checking your fasting blood sugar level. ? If you are at a normal weight and have a low risk for diabetes, have this test once every three years after the age of 45. ? If you are overweight and have a high risk for diabetes, consider being tested at a younger age or more often.  A one-time screening for abdominal aortic aneurysm (AAA) by ultrasound is recommended for men aged 65-75 years who are current or former smokers. What should I know about preventing infection? Hepatitis B If you have a higher risk for hepatitis B, you should be screened for this virus. Talk with your health care provider to find out if you are at risk for hepatitis B infection. Hepatitis C Blood testing is recommended for:  Everyone born from 1945 through 1965.  Anyone with known risk factors for hepatitis C.  Sexually Transmitted Diseases (STDs)  You should be screened each year for STDs including gonorrhea and chlamydia if: ? You are sexually active  and are younger than 42 years of age. ? You are older than 42 years of age and your health care provider tells you that you are at risk for this type of infection. ? Your sexual activity has changed since you were last screened and you are at an increased risk for chlamydia or gonorrhea. Ask your health care provider if you are at risk.  Talk with your health care provider about whether you are at high risk of being infected with HIV. Your health care provider may recommend a prescription medicine to help prevent HIV infection.  What else can I do?  Schedule regular health, dental, and eye exams.  Stay current with your vaccines (immunizations).  Do not use any tobacco products, such as cigarettes, chewing tobacco, and e-cigarettes. If you need help quitting, ask your health care provider.  Limit alcohol intake to no more than 2 drinks per day. One drink equals   12 ounces of beer, 5 ounces of wine, or 1 ounces of hard liquor.  Do not use street drugs.  Do not share needles.  Ask your health care provider for help if you need support or information about quitting drugs.  Tell your health care provider if you often feel depressed.  Tell your health care provider if you have ever been abused or do not feel safe at home. This information is not intended to replace advice given to you by your health care provider. Make sure you discuss any questions you have with your health care provider. Document Released: 11/05/2007 Document Revised: 01/06/2016 Document Reviewed: 02/10/2015 Elsevier Interactive Patient Education  2018 [employer]  Atrial Fibrillation Atrial fibrillation is a type of heartbeat that is irregular or fast (rapid). If you have this condition, your heart keeps quivering in a weird (chaotic) way. This condition can make it so your heart cannot pump blood normally. Having this condition gives a person more risk for stroke, heart failure, and other heart problems. There are  different types of atrial fibrillation. Talk with your doctor to learn about the type that you have. Follow these instructions at home:  Take over-the-counter and prescription medicines only as told by your doctor.  If your doctor prescribed a blood-thinning medicine, take it exactly as told. Taking too much of it can cause bleeding. If you do not take enough of it, you will not have the protection that you need against stroke and other problems.  Do not use any tobacco products. These include cigarettes, chewing tobacco, and e-cigarettes. If you need help quitting, ask your doctor.  If you have apnea (obstructive sleep apnea), manage it as told by your doctor.  Do not drink alcohol.  Do not drink beverages that have caffeine. These include coffee, soda, and tea.  Maintain a healthy weight. Do not use diet pills unless your doctor says they are safe for you. Diet pills may make heart problems worse.  Follow diet instructions as told by your doctor.  Exercise regularly as told by your doctor.  Keep all follow-up visits as told by your doctor. This is important. Contact a doctor if:  You notice a change in the speed, rhythm, or strength of your heartbeat.  You are taking a blood-thinning medicine and you notice more bruising.  You get tired more easily when you move or exercise. Get help right away if:  You have pain in your chest or your belly (abdomen).  You have sweating or weakness.  You feel sick to your stomach (nauseous).  You notice blood in your throw up (vomit), poop (stool), or pee (urine).  You are short of breath.  You suddenly have swollen feet and ankles.  You feel dizzy.  Your suddenly get weak or numb in your face, arms, or legs, especially if it happens on one side of your body.  You have trouble talking, trouble understanding, or both.  Your face or your eyelid droops on one side. These symptoms may be an emergency. Do not wait to see if the symptoms  will go away. Get medical help right away. Call your local emergency services (911 in the [place] Do not drive yourself to the hospital. This information is not intended to replace advice given to you by your health care provider. Make sure you discuss any questions you have with your health care provider. Document Released: 02/16/2008 Document Revised: 10/15/2015 Document Reviewed: 09/03/2014 Elsevier Interactive Patient Education  [employer]2018 [employer]

## 2018-01-15 NOTE — Progress Notes (Signed)
Katheran James 42 y.o.   Chief Complaint  Patient presents with  . Annual Exam    HISTORY OF PRESENT ILLNESS: This is a 42 y.o. male Here for annual exam; no complaints and no medical concerns. Has a history of paroxysmal A. fib.  On no medications.  Has been evaluated by cardiologist.  Also had echocardiogram last year.  Asymptomatic.  Saw cardiologist last June 2018. Non-smoker.  Recently returned from trip to [place].  Stressful job.  Wife expecting next November.  First time.  Has not been able to sleep very well due to the traveling. Healthy lifestyle.      HPI   Prior to Admission medications   Medication Sig Start Date End Date Taking? Authorizing Provider  doxycycline (VIBRAMYCIN) 100 MG capsule Take 100 mg by mouth 2 (two) times daily.   Yes [provider]  metroNIDAZOLE (METROCREAM) 0.75 % cream Apply 1 application topically 2 (two) times daily.   Yes [provider]    No Known Allergies  Patient Active Problem List   Diagnosis Date Noted  . New onset atrial fibrillation (HCC) 10/13/2016    Past Medical History:  Diagnosis Date  . Headache, cluster   . Kidney stone     Past Surgical History:  Procedure Laterality Date  . SPINE SURGERY    . THUMB ARTHROSCOPY      Social History   Socioeconomic History  . Marital status: Single    Spouse name: Not on file  . Number of children: 0  . Years of education: Not on file  . Highest education level: Not on file  Occupational History  . Occupation: Sales promotion account executive [employer].  Social Needs  . Financial resource strain: Not on file  . Food insecurity:    Worry: Not on file    Inability: Not on file  . Transportation needs:    Medical: Not on file    Non-medical: Not on file  Tobacco Use  . Smoking status: Never Smoker  . Smokeless tobacco: Never Used  Substance and Sexual Activity  . Alcohol use: Yes  . Drug use: No  . Sexual activity: Yes  Lifestyle  . Physical activity:    Days per  week: Not on file    Minutes per session: Not on file  . Stress: Not on file  Relationships  . Social connections:    Talks on phone: Not on file    Gets together: Not on file    Attends religious service: Not on file    Active member of club or organization: Not on file    Attends meetings of clubs or organizations: Not on file    Relationship status: Not on file  . Intimate partner violence:    Fear of current or ex partner: Not on file    Emotionally abused: Not on file    Physically abused: Not on file    Forced sexual activity: Not on file  Other Topics Concern  . Not on file  Social History Narrative  . Not on file    Family History  Problem Relation Age of Onset  . Arrhythmia Mother   . Hyperlipidemia Father      Review of Systems  Constitutional: Negative.  Negative for chills, fever and weight loss.  HENT: Negative.   Eyes: Negative.  Negative for blurred vision and double vision.  Respiratory: Negative.  Negative for cough and shortness of breath.   Cardiovascular: Positive for palpitations. Negative for chest pain, orthopnea,  claudication and PND.  Gastrointestinal: Negative.  Negative for abdominal pain, diarrhea, nausea and vomiting.  Musculoskeletal: Negative.  Negative for back pain, joint pain, myalgias and neck pain.  Skin: Negative.   Neurological: Negative.  Negative for dizziness, speech change, focal weakness, loss of consciousness, weakness and headaches.  Endo/Heme/Allergies: Negative.   All other systems reviewed and are negative.   Vitals:   01/15/18 1344  BP: 111/71  Pulse: 89  Resp: 16  Temp: 98.2 F (36.8 C)  SpO2: 98%   CHA2DS2/VAS Stroke Risk Points  Current as of 10 minutes ago     0 >= 2 Points: High Risk  1 - 1.99 Points: Medium Risk  0 Points: Low Risk    This is the only CHA2DS2/VAS Stroke Risk Points available for the past  year.:  Last Change: N/A     Details    This score determines the patient's risk of having a stroke  if the  patient has atrial fibrillation.       Points Metrics  0 Has Congestive Heart Failure:  No    Current as of 10 minutes ago  0 Has Vascular Disease:  No    Current as of 10 minutes ago  0 Has Hypertension:  No    Current as of 10 minutes ago  0 Age:  82    Current as of 10 minutes ago  0 Has Diabetes:  No    Current as of 10 minutes ago  0 Had Stroke:  No  Had TIA:  No  Had thromboembolism:  No    Current as of 10 minutes ago  0 Male:  No    Current as of 10 minutes ago           Physical Exam  Constitutional: He appears well-developed and well-nourished.  HENT:  Head: Normocephalic and atraumatic.  Nose: Nose normal.  Mouth/Throat: Oropharynx is clear and moist.  Eyes: Pupils are equal, round, and reactive to light. Conjunctivae and EOM are normal.  Neck: Normal range of motion. Neck supple. No JVD present.  Cardiovascular: Normal rate. An irregularly irregular rhythm present.  No murmur heard. Pulmonary/Chest: Effort normal and breath sounds normal.  Abdominal: Soft. There is no tenderness.  Musculoskeletal: Normal range of motion. He exhibits no edema or tenderness.  Lymphadenopathy:    He has no cervical adenopathy.  Skin: Skin is warm and dry. Capillary refill takes less than 2 seconds.  Psychiatric: He has a normal mood and affect. His behavior is normal.  Vitals reviewed.  EKG: A. fib with ventricular rate of 91/min.  No acute ischemic changes.  ASSESSMENT & PLAN: Donyell was seen today for annual exam.  Diagnoses and all orders for this visit:  Routine general medical examination at a health care facility -     Tdap vaccine greater than or equal to 7yo IM  Paroxysmal atrial fibrillation (HCC) -     EKG 12-Lead  Need for prophylactic vaccination and inoculation against influenza -     Flu Vaccine QUAD 36+ mos IM    Patient Instructions       If you have lab work done today you will be contacted with your lab results within the next 2  weeks.  If you have not heard from [place] then please contact us. The fastest way to get your results is to register for My Chart.   IF you received an x-ray today, you will receive an invoice from [facility]. Please contact [facility]  Radiology at [phone] with questions or concerns regarding your invoice.   IF you received labwork today, you will receive an invoice from [facility]. Please contact [facility] at [phone] with questions or concerns regarding your invoice.   Our billing staff will not be able to assist you with questions regarding bills from these companies.  You will be contacted with the lab results as soon as they are available. The fastest way to get your results is to activate your My Chart account. Instructions are located on the last page of this paperwork. If you have not heard from us regarding the results in 2 weeks, please contact this office.      Health Maintenance, Male A healthy lifestyle and preventive care is important for your health and wellness. Ask your health care provider about what schedule of regular examinations is right for you. What should I know about weight and diet? Eat a Healthy Diet  Eat plenty of vegetables, fruits, whole grains, low-fat dairy products, and lean protein.  Do not eat a lot of foods high in solid fats, added sugars, or salt.  Maintain a Healthy Weight Regular exercise can help you achieve or maintain a healthy weight. You should:  Do at least 150 minutes of exercise each week. The exercise should increase your heart rate and make you sweat (moderate-intensity exercise).  Do strength-training exercises at least twice a week.  Watch Your Levels of Cholesterol and Blood Lipids  Have your blood tested for lipids and cholesterol every 5 years starting at 42 years of age. If you are at high risk for heart disease, you should start having your blood tested when you are 42 years old. You may need to have your  cholesterol levels checked more often if: ? Your lipid or cholesterol levels are high. ? You are older than 42 years of age. ? You are at high risk for heart disease.  What should I know about cancer screening? Many types of cancers can be detected early and may often be prevented. Lung Cancer  You should be screened every year for lung cancer if: ? You are a current smoker who has smoked for at least 30 years. ? You are a former smoker who has quit within the past 15 years.  Talk to your health care provider about your screening options, when you should start screening, and how often you should be screened.  Colorectal Cancer  Routine colorectal cancer screening usually begins at 42 years of age and should be repeated every 5-10 years until you are 42 years old. You may need to be screened more often if early forms of precancerous polyps or small growths are found. Your health care provider may recommend screening at an earlier age if you have risk factors for colon cancer.  Your health care provider may recommend using home test kits to check for hidden blood in the stool.  A small camera at the end of a tube can be used to examine your colon (sigmoidoscopy or colonoscopy). This checks for the earliest forms of colorectal cancer.  Prostate and Testicular Cancer  Depending on your age and overall health, your health care provider may do certain tests to screen for prostate and testicular cancer.  Talk to your health care provider about any symptoms or concerns you have about testicular or prostate cancer.  Skin Cancer  Check your skin from head to toe regularly.  Tell your health care provider about any new moles or changes in moles, especially  if: ? There is a change in a mole's size, shape, or color. ? You have a mole that is larger than a pencil eraser.  Always use sunscreen. Apply sunscreen liberally and repeat throughout the day.  Protect yourself by wearing long sleeves,  pants, a wide-brimmed hat, and sunglasses when outside.  What should I know about heart disease, diabetes, and high blood pressure?  If you are 70-51 years of age, have your blood pressure checked every 3-5 years. If you are 78 years of age or older, have your blood pressure checked every year. You should have your blood pressure measured twice-once when you are at a hospital or clinic, and once when you are not at a hospital or clinic. Record the average of the two measurements. To check your blood pressure when you are not at a hospital or clinic, you can use: ? An automated blood pressure machine at a pharmacy. ? A home blood pressure monitor.  Talk to your health care provider about your target blood pressure.  If you are between 56-2 years old, ask your health care provider if you should take aspirin to prevent heart disease.  Have regular diabetes screenings by checking your fasting blood sugar level. ? If you are at a normal weight and have a low risk for diabetes, have this test once every three years after the age of 1. ? If you are overweight and have a high risk for diabetes, consider being tested at a younger age or more often.  A one-time screening for abdominal aortic aneurysm (AAA) by ultrasound is recommended for men aged 65-75 years who are current or former smokers. What should I know about preventing infection? Hepatitis B If you have a higher risk for hepatitis B, you should be screened for this virus. Talk with your health care provider to find out if you are at risk for hepatitis B infection. Hepatitis C Blood testing is recommended for:  Everyone born from [birth year] through 1965.  Anyone with known risk factors for hepatitis C.  Sexually Transmitted Diseases (STDs)  You should be screened each year for STDs including gonorrhea and chlamydia if: ? You are sexually active and are younger than 42 years of age. ? You are older than 42 years of age and your health care  provider tells you that you are at risk for this type of infection. ? Your sexual activity has changed since you were last screened and you are at an increased risk for chlamydia or gonorrhea. Ask your health care provider if you are at risk.  Talk with your health care provider about whether you are at high risk of being infected with HIV. Your health care provider may recommend a prescription medicine to help prevent HIV infection.  What else can I do?  Schedule regular health, dental, and eye exams.  Stay current with your vaccines (immunizations).  Do not use any tobacco products, such as cigarettes, chewing tobacco, and e-cigarettes. If you need help quitting, ask your health care provider.  Limit alcohol intake to no more than 2 drinks per day. One drink equals 12 ounces of beer, 5 ounces of wine, or 1 ounces of hard liquor.  Do not use street drugs.  Do not share needles.  Ask your health care provider for help if you need support or information about quitting drugs.  Tell your health care provider if you often feel depressed.  Tell your health care provider if you have ever been abused or do  not feel safe at home. This information is not intended to replace advice given to you by your health care provider. Make sure you discuss any questions you have with your health care provider. Document Released: 11/05/2007 Document Revised: 01/06/2016 Document Reviewed: 02/10/2015 Elsevier Interactive Patient Education  2018 [employer].  Atrial Fibrillation Atrial fibrillation is a type of heartbeat that is irregular or fast (rapid). If you have this condition, your heart keeps quivering in a weird (chaotic) way. This condition can make it so your heart cannot pump blood normally. Having this condition gives a person more risk for stroke, heart failure, and other heart problems. There are different types of atrial fibrillation. Talk with your doctor to learn about the type that you  have. Follow these instructions at home:  Take over-the-counter and prescription medicines only as told by your doctor.  If your doctor prescribed a blood-thinning medicine, take it exactly as told. Taking too much of it can cause bleeding. If you do not take enough of it, you will not have the protection that you need against stroke and other problems.  Do not use any tobacco products. These include cigarettes, chewing tobacco, and e-cigarettes. If you need help quitting, ask your doctor.  If you have apnea (obstructive sleep apnea), manage it as told by your doctor.  Do not drink alcohol.  Do not drink beverages that have caffeine. These include coffee, soda, and tea.  Maintain a healthy weight. Do not use diet pills unless your doctor says they are safe for you. Diet pills may make heart problems worse.  Follow diet instructions as told by your doctor.  Exercise regularly as told by your doctor.  Keep all follow-up visits as told by your doctor. This is important. Contact a doctor if:  You notice a change in the speed, rhythm, or strength of your heartbeat.  You are taking a blood-thinning medicine and you notice more bruising.  You get tired more easily when you move or exercise. Get help right away if:  You have pain in your chest or your belly (abdomen).  You have sweating or weakness.  You feel sick to your stomach (nauseous).  You notice blood in your throw up (vomit), poop (stool), or pee (urine).  You are short of breath.  You suddenly have swollen feet and ankles.  You feel dizzy.  Your suddenly get weak or numb in your face, arms, or legs, especially if it happens on one side of your body.  You have trouble talking, trouble understanding, or both.  Your face or your eyelid droops on one side. These symptoms may be an emergency. Do not wait to see if the symptoms will go away. Get medical help right away. Call your local emergency services (911 in the  [place] Do not drive yourself to the hospital. This information is not intended to replace advice given to you by your health care provider. Make sure you discuss any questions you have with your health care provider. Document Released: 02/16/2008 Document Revised: 10/15/2015 Document Reviewed: 09/03/2014 Elsevier Interactive Patient Education  2018 [employer].      Edwina Barth, MD [facility]

## 2018-03-04 IMAGING — DX DG HAND COMPLETE 3+V*L*
3 series · 3 of 3 positions shown · non-contrast
Comparison: None.

CLINICAL DATA: Crushing injury to left hand, initial encounter

EXAM:
LEFT HAND - COMPLETE 3+ VIEW

[hand pa]
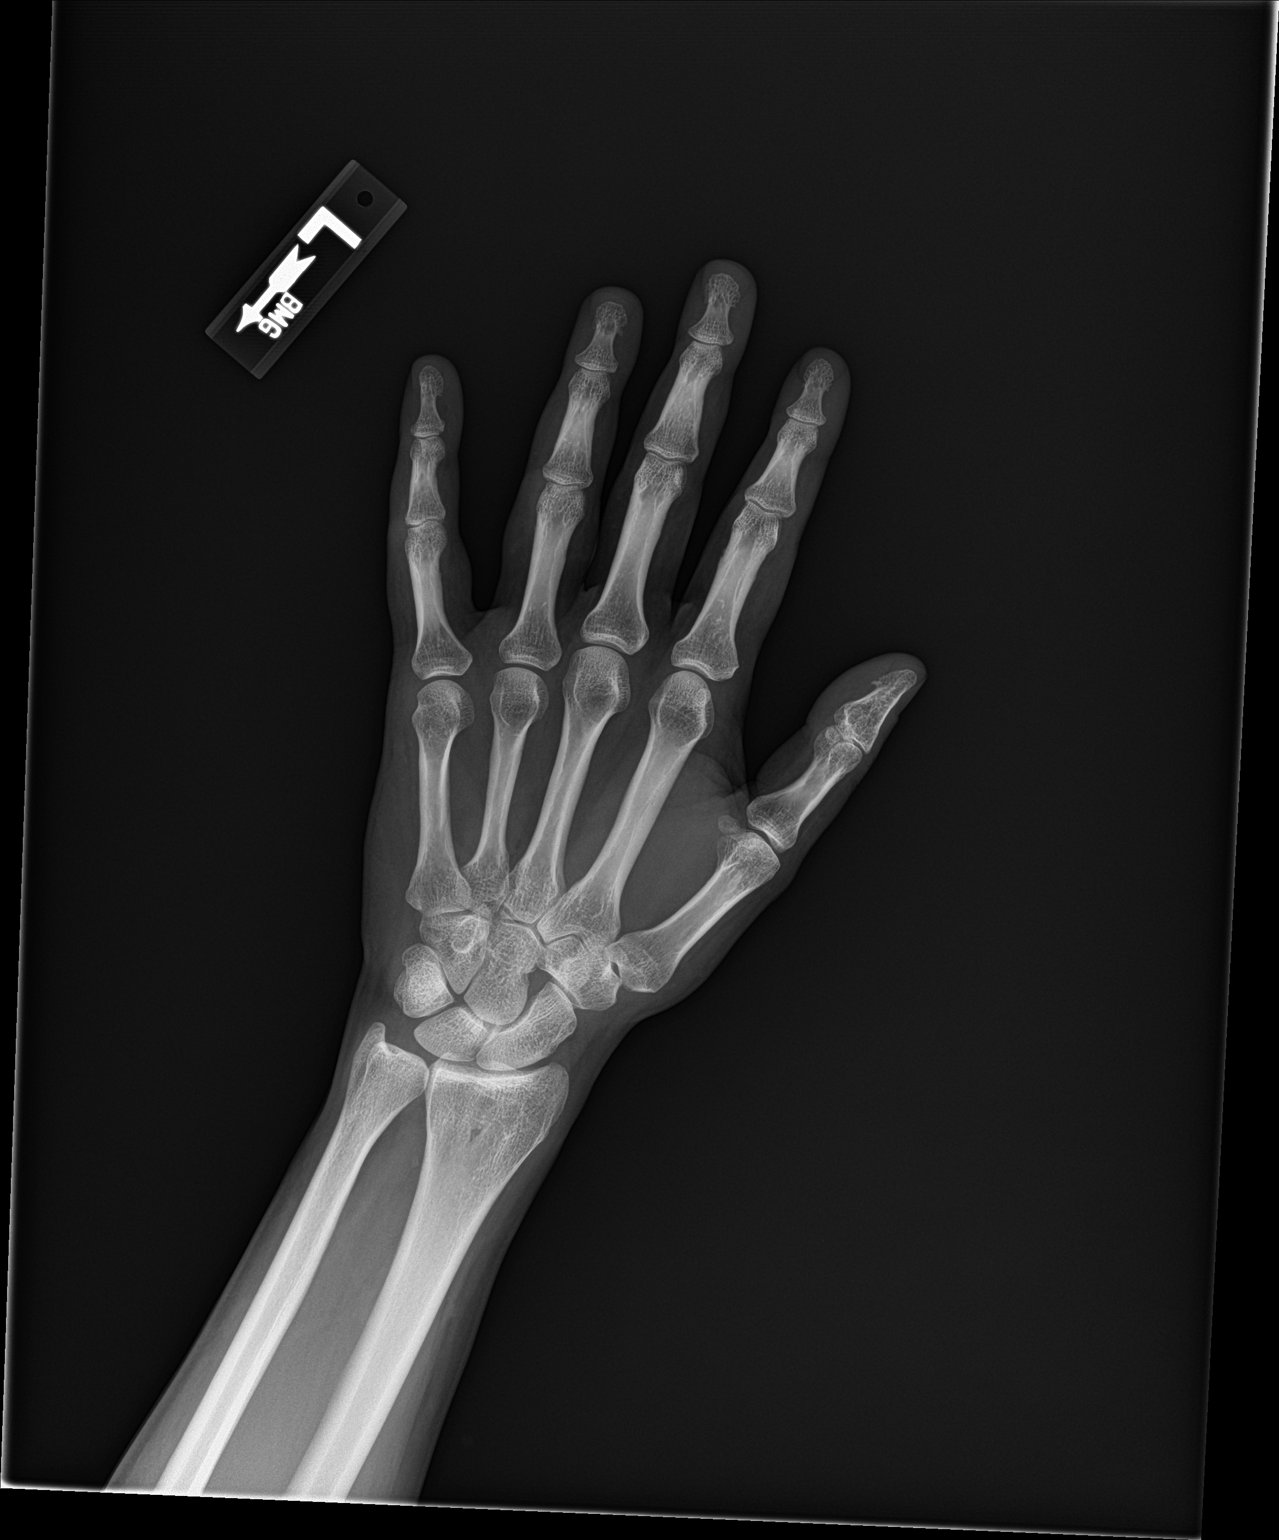

[hand obl]
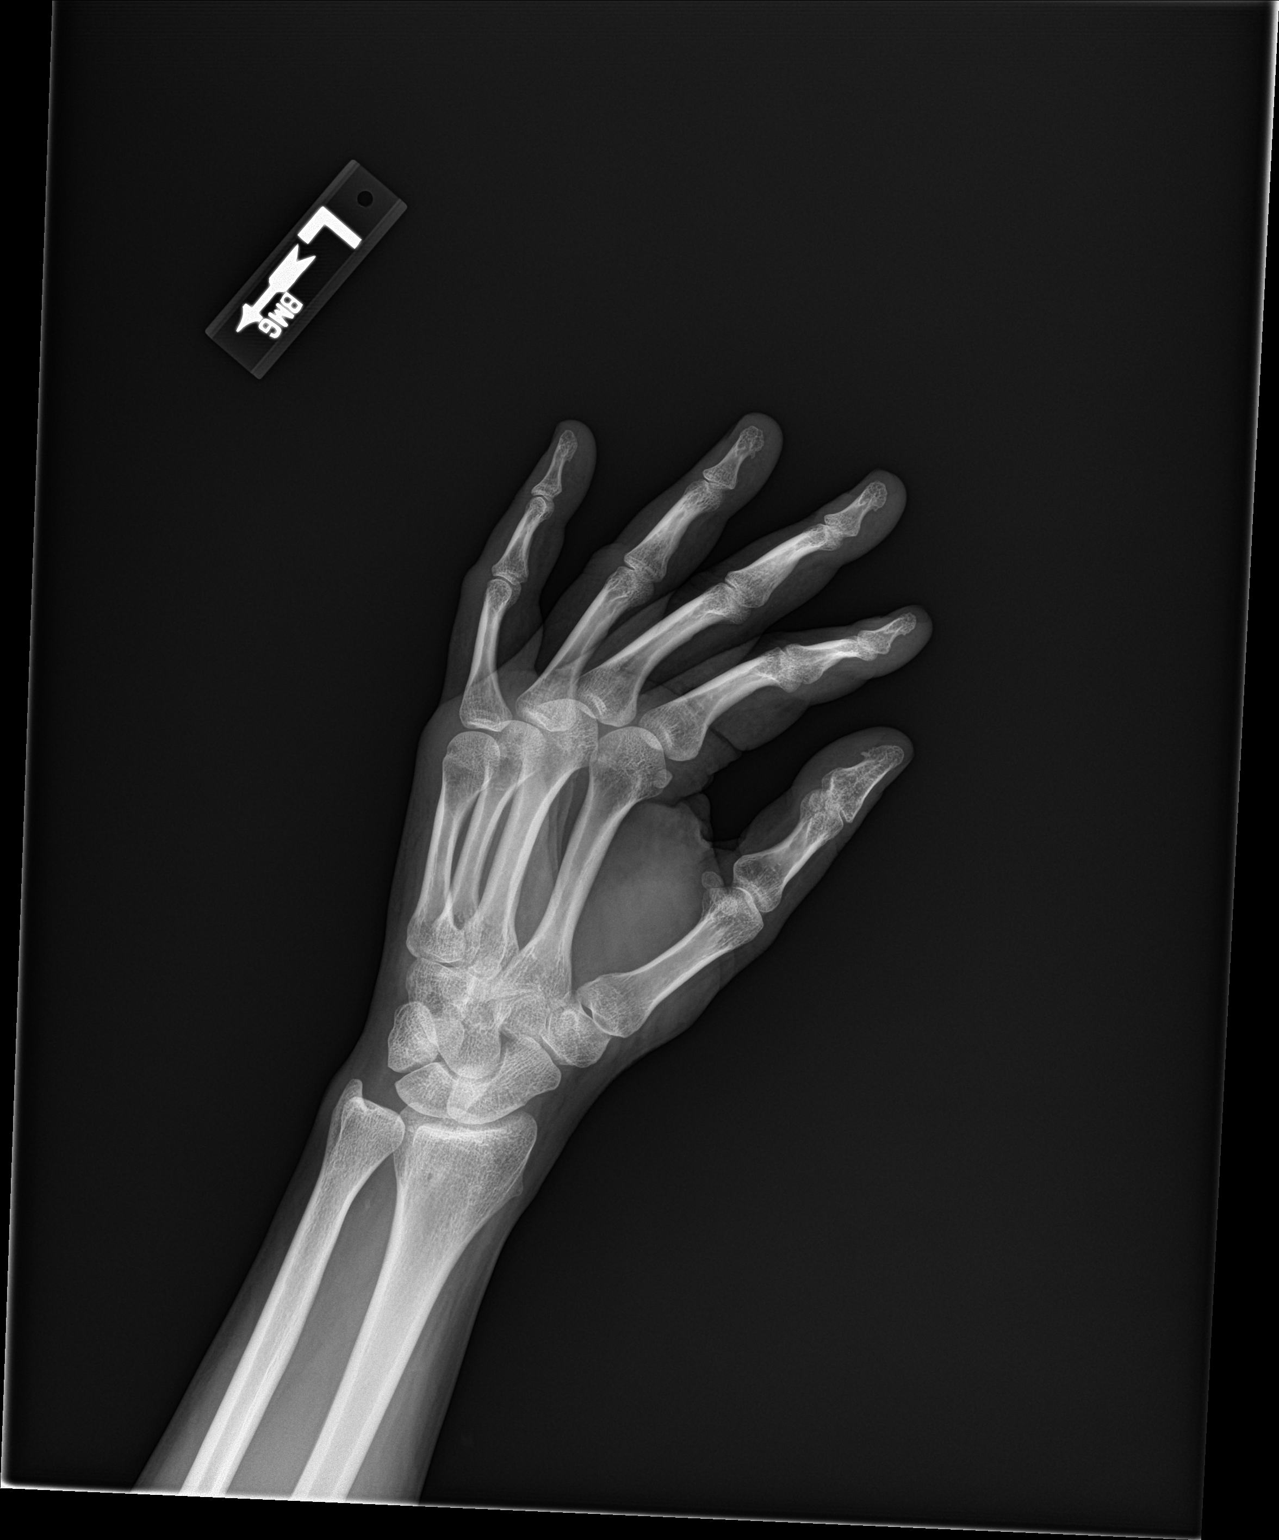

[hand lat]
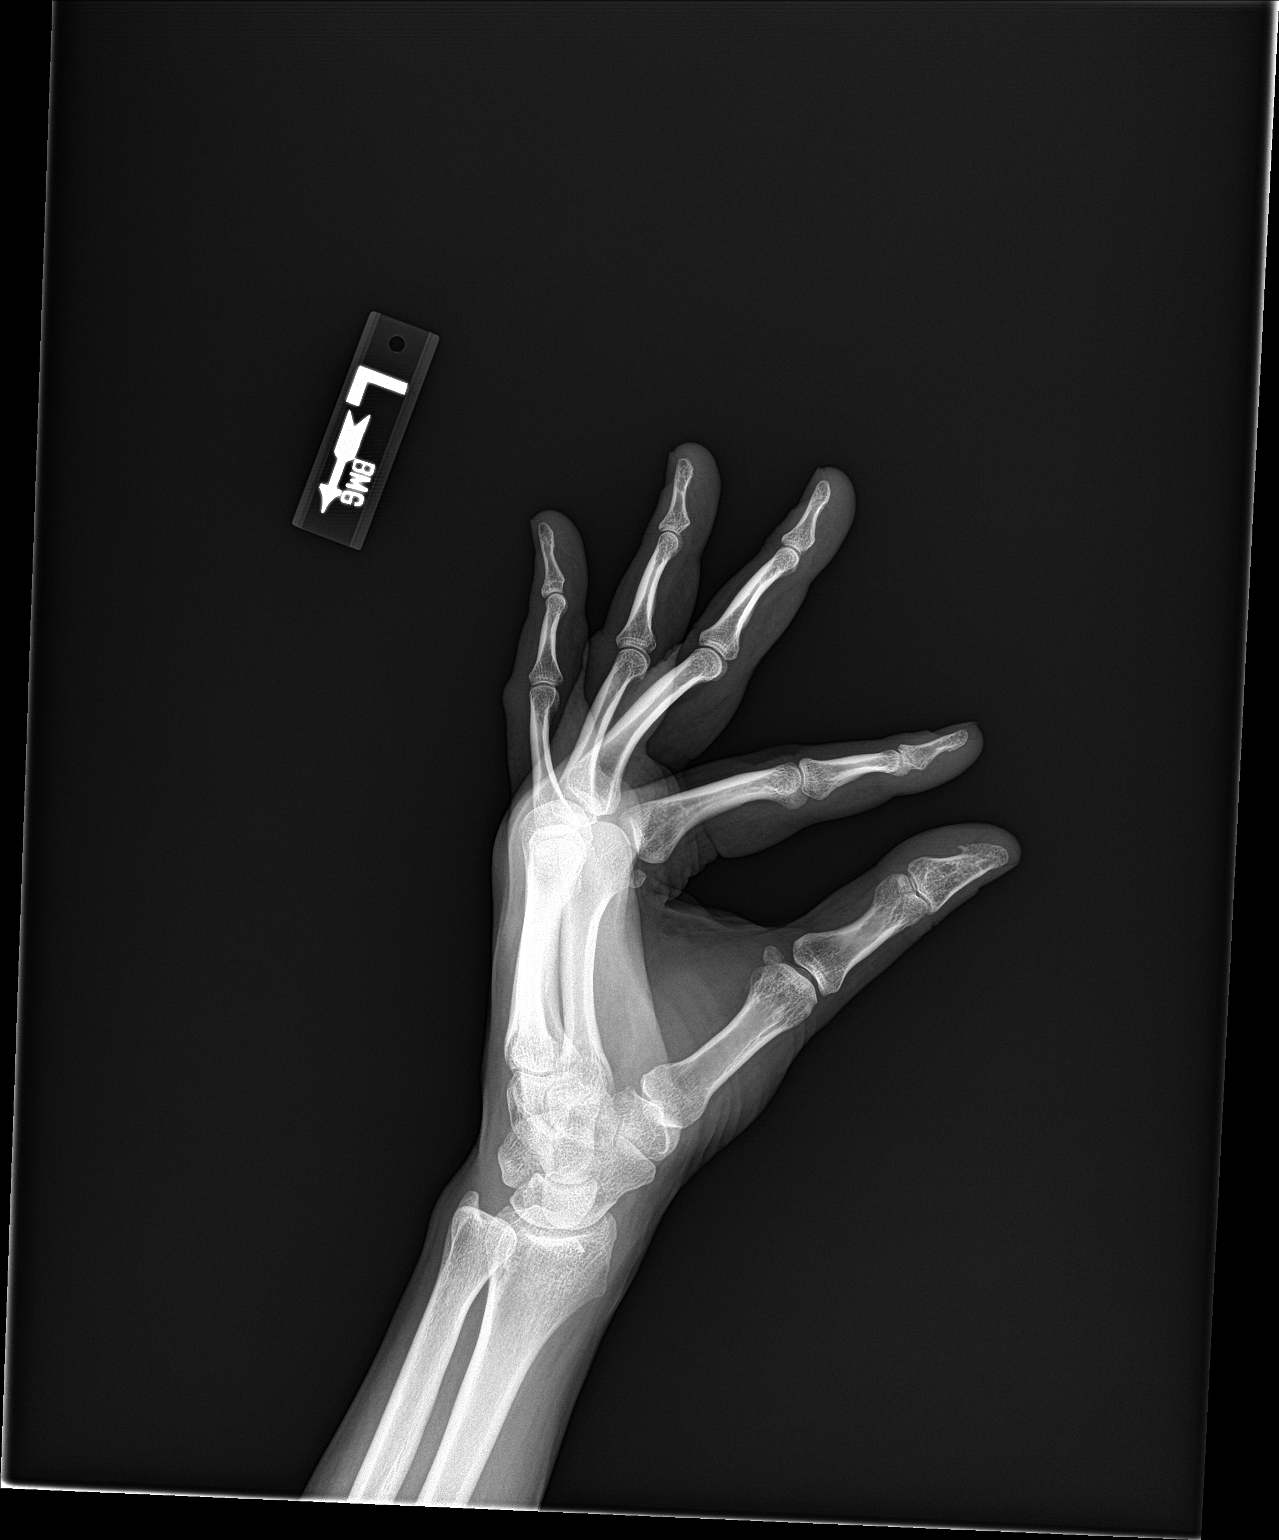

[3 of 3 positions shown; findings below may reference images not displayed]

FINDINGS: There is no evidence of fracture or dislocation. There is no
evidence of arthropathy or other focal bone abnormality. Soft
tissues are unremarkable.
IMPRESSION: No acute abnormality noted.

## 2019-09-24 ENCOUNTER — Ambulatory Visit (INDEPENDENT_AMBULATORY_CARE_PROVIDER_SITE_OTHER): Payer: No Typology Code available for payment source | Admitting: Emergency Medicine

## 2019-09-24 ENCOUNTER — Encounter: Payer: Self-pay | Admitting: Emergency Medicine

## 2019-09-24 ENCOUNTER — Other Ambulatory Visit: Payer: Self-pay

## 2019-09-24 VITALS — BP 108/68 | HR 58 | Temp 97.8°F | Resp 16 | Ht 75.98 in | Wt 221.0 lb

## 2019-09-24 DIAGNOSIS — Z1329 Encounter for screening for other suspected endocrine disorder: Secondary | ICD-10-CM

## 2019-09-24 DIAGNOSIS — Z0000 Encounter for general adult medical examination without abnormal findings: Secondary | ICD-10-CM

## 2019-09-24 DIAGNOSIS — Z13220 Encounter for screening for lipoid disorders: Secondary | ICD-10-CM | POA: Diagnosis not present

## 2019-09-24 DIAGNOSIS — G44011 Episodic cluster headache, intractable: Secondary | ICD-10-CM | POA: Diagnosis not present

## 2019-09-24 DIAGNOSIS — Z130 Encounter for screening for diseases of the blood and blood-forming organs and certain disorders involving the immune mechanism: Secondary | ICD-10-CM | POA: Diagnosis not present

## 2019-09-24 DIAGNOSIS — G44009 Cluster headache syndrome, unspecified, not intractable: Secondary | ICD-10-CM | POA: Insufficient documentation

## 2019-09-24 DIAGNOSIS — Z0001 Encounter for general adult medical examination with abnormal findings: Secondary | ICD-10-CM

## 2019-09-24 DIAGNOSIS — Z13228 Encounter for screening for other metabolic disorders: Secondary | ICD-10-CM

## 2019-09-24 MED ORDER — ZOLMITRIPTAN 2.5 MG PO TABS: 2.5000 mg | ORAL_TABLET | Freq: Once | ORAL | 3 refills | Status: AC

## 2019-09-24 NOTE — Patient Instructions (Addendum)
   If you have lab work done today you will be contacted with your lab results within the next 2 weeks.  If you have not heard from us then please contact us. The fastest way to get your results is to register for My Chart.   IF you received an x-ray today, you will receive an invoice from [facility]. Please contact [facility] at [phone] with questions or concerns regarding your invoice.   IF you received labwork today, you will receive an invoice from [facility]. Please contact [facility] at [phone] with questions or concerns regarding your invoice.   Our billing staff will not be able to assist you with questions regarding bills from these companies.  You will be contacted with the lab results as soon as they are available. The fastest way to get your results is to activate your My Chart account. Instructions are located on the last page of this paperwork. If you have not heard from us regarding the results in 2 weeks, please contact this office.      Health Maintenance, Male Adopting a healthy lifestyle and getting preventive care are important in promoting health and wellness. Ask your health care provider about:  The right schedule for you to have regular tests and exams.  Things you can do on your own to prevent diseases and keep yourself healthy. What should I know about diet, weight, and exercise? Eat a healthy diet   Eat a diet that includes plenty of vegetables, fruits, low-fat dairy products, and lean protein.  Do not eat a lot of foods that are high in solid fats, added sugars, or sodium. Maintain a healthy weight Body mass index (BMI) is a measurement that can be used to identify possible weight problems. It estimates body fat based on height and weight. Your health care provider can help determine your BMI and help you achieve or maintain a healthy weight. Get regular exercise Get regular exercise. This is one of the most important things you  can do for your health. Most adults should:  Exercise for at least 150 minutes each week. The exercise should increase your heart rate and make you sweat (moderate-intensity exercise).  Do strengthening exercises at least twice a week. This is in addition to the moderate-intensity exercise.  Spend less time sitting. Even light physical activity can be beneficial. Watch cholesterol and blood lipids Have your blood tested for lipids and cholesterol at 44 years of age, then have this test every 5 years. You may need to have your cholesterol levels checked more often if:  Your lipid or cholesterol levels are high.  You are older than 44 years of age.  You are at high risk for heart disease. What should I know about cancer screening? Many types of cancers can be detected early and may often be prevented. Depending on your health history and family history, you may need to have cancer screening at various ages. This may include screening for:  Colorectal cancer.  Prostate cancer.  Skin cancer.  Lung cancer. What should I know about heart disease, diabetes, and high blood pressure? Blood pressure and heart disease  High blood pressure causes heart disease and increases the risk of stroke. This is more likely to develop in people who have high blood pressure readings, are of African descent, or are overweight.  Talk with your health care provider about your target blood pressure readings.  Have your blood pressure checked: ? Every 3-5 years if you are 18-39   years of age. ? Every year if you are 67 years old or older.  If you are between the ages of 73 and 75 and are a current or former smoker, ask your health care provider if you should have a one-time screening for abdominal aortic aneurysm (AAA). Diabetes Have regular diabetes screenings. This checks your fasting blood sugar level. Have the screening done:  Once every three years after age 43 if you are at a normal weight and have  a low risk for diabetes.  More often and at a younger age if you are overweight or have a high risk for diabetes. What should I know about preventing infection? Hepatitis B If you have a higher risk for hepatitis B, you should be screened for this virus. Talk with your health care provider to find out if you are at risk for hepatitis B infection. Hepatitis C Blood testing is recommended for:  Everyone born from [birth year] through 1965.  Anyone with known risk factors for hepatitis C. Sexually transmitted infections (STIs)  You should be screened each year for STIs, including gonorrhea and chlamydia, if: ? You are sexually active and are younger than 44 years of age. ? You are older than 44 years of age and your health care provider tells you that you are at risk for this type of infection. ? Your sexual activity has changed since you were last screened, and you are at increased risk for chlamydia or gonorrhea. Ask your health care provider if you are at risk.  Ask your health care provider about whether you are at high risk for HIV. Your health care provider may recommend a prescription medicine to help prevent HIV infection. If you choose to take medicine to prevent HIV, you should first get tested for HIV. You should then be tested every 3 months for as long as you are taking the medicine. Follow these instructions at home: Lifestyle  Do not use any products that contain nicotine or tobacco, such as cigarettes, e-cigarettes, and chewing tobacco. If you need help quitting, ask your health care provider.  Do not use street drugs.  Do not share needles.  Ask your health care provider for help if you need support or information about quitting drugs. Alcohol use  Do not drink alcohol if your health care provider tells you not to drink.  If you drink alcohol: ? Limit how much you have to 0-2 drinks a day. ? Be aware of how much alcohol is in your drink. In the [place] one drink equals one 12  oz bottle of beer (355 mL), one 5 oz glass of wine (148 mL), or one 1 oz glass of hard liquor (44 mL). General instructions  Schedule regular health, dental, and eye exams.  Stay current with your vaccines.  Tell your health care provider if: ? You often feel depressed. ? You have ever been abused or do not feel safe at home. Summary  Adopting a healthy lifestyle and getting preventive care are important in promoting health and wellness.  Follow your health care provider's instructions about healthy diet, exercising, and getting tested or screened for diseases.  Follow your health care provider's instructions on monitoring your cholesterol and blood pressure. This information is not intended to replace advice given to you by your health care provider. Make sure you discuss any questions you have with your health care provider. Document Revised: 05/02/2018 Document Reviewed: 05/02/2018 Elsevier Patient Education  2020 [employer]  Cluster Headache Cluster headaches are  deeply painful. They normally occur on one side of your head, but they may switch sides. Often, cluster headaches:  Are severe.  Happen often for a few weeks or months and then go away for a while.  Last from 15 minutes to 3 hours.  Happen at the same time each day.  Happen at night.  Happen many times a day. Follow these instructions at home:        Follow instructions from your doctor to care for yourself at home:  Go to bed at the same time each night. Get the same amount of sleep every night.  Avoid alcohol.  Stop smoking if you smoke. This includes cigarettes and e-cigarettes.  Take over-the-counter and prescription medicines only as told by your doctor.  Do not drive or use heavy machinery while taking prescription pain medicine.  Use oxygen as told by your doctor.  Exercise regularly.  Eat a healthy diet.  Write down when each headache happened, what kind of pain you had, how bad  your pain was, and what you tried to help your pain. This is called a headache diary. Use it as told by your doctor. Contact a doctor if:  Your headaches get worse or they happen more often.  Your medicines are not helping. Get help right away if:  You pass out (faint).  You get weak or lose feeling (have numbness) on one side of your body or face.  You see two of everything (double vision).  You feel sick to your stomach (nauseous) or you throw up (vomit), and you do not stop after many hours.  You have trouble with your balance or with walking.  You have trouble talking.  You have neck pain or stiffness.  You have a fever. This information is not intended to replace advice given to you by your health care provider. Make sure you discuss any questions you have with your health care provider. Document Revised: 03/06/2019 Document Reviewed: 01/15/2016 Elsevier Patient Education  2020 [employer]  Hypertension, Adult High blood pressure (hypertension) is when the force of blood pumping through the arteries is too strong. The arteries are the blood vessels that carry blood from the heart throughout the body. Hypertension forces the heart to work harder to pump blood and may cause arteries to become narrow or stiff. Untreated or uncontrolled hypertension can cause a heart attack, heart failure, a stroke, kidney disease, and other problems. A blood pressure reading consists of a higher number over a lower number. Ideally, your blood pressure should be below 120/80. The first ("top") number is called the systolic pressure. It is a measure of the pressure in your arteries as your heart beats. The second ("bottom") number is called the diastolic pressure. It is a measure of the pressure in your arteries as the heart relaxes. What are the causes? The exact cause of this condition is not known. There are some conditions that result in or are related to high blood pressure. What increases the  risk? Some risk factors for high blood pressure are under your control. The following factors may make you more likely to develop this condition:  Smoking.  Having type 2 diabetes mellitus, high cholesterol, or both.  Not getting enough exercise or physical activity.  Being overweight.  Having too much fat, sugar, calories, or salt (sodium) in your diet.  Drinking too much alcohol. Some risk factors for high blood pressure may be difficult or impossible to change. Some of these factors include:  Having chronic kidney disease.  Having a family history of high blood pressure.  Age. Risk increases with age.  Race. You may be at higher risk if you are African American.  Gender. Men are at higher risk than women before age 55. After age 55, women are at higher risk than men.  Having obstructive sleep apnea.  Stress. What are the signs or symptoms? High blood pressure may not cause symptoms. Very high blood pressure (hypertensive crisis) may cause:  Headache.  Anxiety.  Shortness of breath.  Nosebleed.  Nausea and vomiting.  Vision changes.  Severe chest pain.  Seizures. How is this diagnosed? This condition is diagnosed by measuring your blood pressure while you are seated, with your arm resting on a flat surface, your legs uncrossed, and your feet flat on the floor. The cuff of the blood pressure monitor will be placed directly against the skin of your upper arm at the level of your heart. It should be measured at least twice using the same arm. Certain conditions can cause a difference in blood pressure between your right and left arms. Certain factors can cause blood pressure readings to be lower or higher than normal for a short period of time:  When your blood pressure is higher when you are in a health care provider's office than when you are at home, this is called white coat hypertension. Most people with this condition do not need medicines.  When your blood  pressure is higher at home than when you are in a health care provider's office, this is called masked hypertension. Most people with this condition may need medicines to control blood pressure. If you have a high blood pressure reading during one visit or you have normal blood pressure with other risk factors, you may be asked to:  Return on a different day to have your blood pressure checked again.  Monitor your blood pressure at home for 1 week or longer. If you are diagnosed with hypertension, you may have other blood or imaging tests to help your health care provider understand your overall risk for other conditions. How is this treated? This condition is treated by making healthy lifestyle changes, such as eating healthy foods, exercising more, and reducing your alcohol intake. Your health care provider may prescribe medicine if lifestyle changes are not enough to get your blood pressure under control, and if:  Your systolic blood pressure is above 130.  Your diastolic blood pressure is above 80. Your personal target blood pressure may vary depending on your medical conditions, your age, and other factors. Follow these instructions at home: Eating and drinking   Eat a diet that is high in fiber and potassium, and low in sodium, added sugar, and fat. An example eating plan is called the DASH (Dietary Approaches to Stop Hypertension) diet. To eat this way: ? Eat plenty of fresh fruits and vegetables. Try to fill one half of your plate at each meal with fruits and vegetables. ? Eat whole grains, such as whole-wheat pasta, brown rice, or whole-grain bread. Fill about one fourth of your plate with whole grains. ? Eat or drink low-fat dairy products, such as skim milk or low-fat yogurt. ? Avoid fatty cuts of meat, processed or cured meats, and poultry with skin. Fill about one fourth of your plate with lean proteins, such as fish, chicken without skin, beans, eggs, or tofu. ? Avoid pre-made  and processed foods. These tend to be higher in sodium, added sugar, and fat.  Reduce your daily sodium intake. Most people with hypertension should eat less than 1,500 mg of sodium a day.  Do not drink alcohol if: ? Your health care provider tells you not to drink. ? You are pregnant, may be pregnant, or are planning to become pregnant.  If you drink alcohol: ? Limit how much you use to:  0-1 drink a day for women.  0-2 drinks a day for men. ? Be aware of how much alcohol is in your drink. In the [place] one drink equals one 12 oz bottle of beer (355 mL), one 5 oz glass of wine (148 mL), or one 1 oz glass of hard liquor (44 mL). Lifestyle   Work with your health care provider to maintain a healthy body weight or to lose weight. Ask what an ideal weight is for you.  Get at least 30 minutes of exercise most days of the week. Activities may include walking, swimming, or biking.  Include exercise to strengthen your muscles (resistance exercise), such as Pilates or lifting weights, as part of your weekly exercise routine. Try to do these types of exercises for 30 minutes at least 3 days a week.  Do not use any products that contain nicotine or tobacco, such as cigarettes, e-cigarettes, and chewing tobacco. If you need help quitting, ask your health care provider.  Monitor your blood pressure at home as told by your health care provider.  Keep all follow-up visits as told by your health care provider. This is important. Medicines  Take over-the-counter and prescription medicines only as told by your health care provider. Follow directions carefully. Blood pressure medicines must be taken as prescribed.  Do not skip doses of blood pressure medicine. Doing this puts you at risk for problems and can make the medicine less effective.  Ask your health care provider about side effects or reactions to medicines that you should watch for. Contact a health care provider if you:  Think you are  having a reaction to a medicine you are taking.  Have headaches that keep coming back (recurring).  Feel dizzy.  Have swelling in your ankles.  Have trouble with your vision. Get help right away if you:  Develop a severe headache or confusion.  Have unusual weakness or numbness.  Feel faint.  Have severe pain in your chest or abdomen.  Vomit repeatedly.  Have trouble breathing. Summary  Hypertension is when the force of blood pumping through your arteries is too strong. If this condition is not controlled, it may put you at risk for serious complications.  Your personal target blood pressure may vary depending on your medical conditions, your age, and other factors. For most people, a normal blood pressure is less than 120/80.  Hypertension is treated with lifestyle changes, medicines, or a combination of both. Lifestyle changes include losing weight, eating a healthy, low-sodium diet, exercising more, and limiting alcohol. This information is not intended to replace advice given to you by your health care provider. Make sure you discuss any questions you have with your health care provider. Document Revised: 01/17/2018 Document Reviewed: 01/17/2018 Elsevier Patient Education  2020 [employer].

## 2019-09-24 NOTE — Progress Notes (Signed)
David Hays 44 y.o.   Chief Complaint  Patient presents with  . Headache    x 2 week (4/17/202) come and go with nausea mostly at night    HISTORY OF PRESENT ILLNESS: This is a 44 y.o. male here for annual exam. No history of chronic medical problems.  On no chronic medications. Non-smoker and no EtOH abuser. Complaining of 2-week history of mostly right-sided headache that waxes and wanes with nausea, mostly at night, without visual problems.  Has history of cluster headaches in the past and feels similar.  Tried over-the-counter Tylenol and ibuprofen without success. No other complaints or medical concerns today. Denies any changes in his daily routines or lifestyle choices. States he can eat better and increase his physical activity.  Health self grade: C.  HPI   Prior to Admission medications   Medication Sig Start Date End Date Taking? Authorizing Provider  doxycycline (VIBRAMYCIN) 100 MG capsule Take 100 mg by mouth 2 (two) times daily.   Yes [provider]  metroNIDAZOLE (METROCREAM) 0.75 % cream Apply 1 application topically 2 (two) times daily.   Yes [provider]    No Known Allergies  Patient Active Problem List   Diagnosis Date Noted  . New onset atrial fibrillation (HCC) 10/13/2016    Past Medical History:  Diagnosis Date  . Headache, cluster   . Kidney stone     Past Surgical History:  Procedure Laterality Date  . SPINE SURGERY    . THUMB ARTHROSCOPY      Social History   Socioeconomic History  . Marital status: Single    Spouse name: Not on file  . Number of children: 0  . Years of education: Not on file  . Highest education level: Not on file  Occupational History  . Occupation: Sales promotion account executive [employer].  Tobacco Use  . Smoking status: Never Smoker  . Smokeless tobacco: Never Used  Substance and Sexual Activity  . Alcohol use: Yes  . Drug use: No  . Sexual activity: Yes  Other Topics Concern  . Not on file  Social  History Narrative  . Not on file   Social Determinants of Health   Financial Resource Strain:   . Difficulty of Paying Living Expenses:   Food Insecurity:   . Worried About Programme researcher, broadcasting/film/video in the Last Year:   . Barista in the Last Year:   Transportation Needs:   . Freight forwarder (Medical):   Marland Kitchen Lack of Transportation (Non-Medical):   Physical Activity:   . Days of Exercise per Week:   . Minutes of Exercise per Session:   Stress:   . Feeling of Stress :   Social Connections:   . Frequency of Communication with Friends and Family:   . Frequency of Social Gatherings with Friends and Family:   . Attends Religious Services:   . Active Member of Clubs or Organizations:   . Attends Banker Meetings:   Marland Kitchen Marital Status:   Intimate Partner Violence:   . Fear of Current or Ex-Partner:   . Emotionally Abused:   Marland Kitchen Physically Abused:   . Sexually Abused:     Family History  Problem Relation Age of Onset  . Arrhythmia Mother   . Hyperlipidemia Father      Review of Systems  Constitutional: Negative.  Negative for chills and fever.  HENT: Negative.  Negative for congestion and sore throat.   Eyes: Negative.  Negative for blurred  vision and double vision.  Respiratory: Negative.  Negative for cough and shortness of breath.   Cardiovascular: Negative.  Negative for chest pain and palpitations.  Gastrointestinal: Negative.  Negative for abdominal pain, blood in stool, diarrhea, nausea and vomiting.  Genitourinary: Negative.  Negative for dysuria and hematuria.  Musculoskeletal: Negative.  Negative for back pain, myalgias and neck pain.  Skin: Negative.  Negative for rash.  Neurological: Positive for headaches. Negative for dizziness, speech change and focal weakness.  Endo/Heme/Allergies: Negative.   All other systems reviewed and are negative.  Today's Vitals   09/24/19 1039  BP: 108/68  Pulse: (!) 58  Resp: 16  Temp: 97.8 F (36.6 C)    TempSrc: Temporal  SpO2: 99%  Weight: 221 lb (100.2 kg)  Height: 6' 3.98" (1.93 m)   Body mass index is 26.91 kg/m.   Physical Exam Vitals reviewed.  Constitutional:      Appearance: He is well-developed.  HENT:     Head: Normocephalic.     Right Ear: Tympanic membrane, ear canal and external ear normal.     Left Ear: Tympanic membrane, ear canal and external ear normal.     Mouth/Throat:     Mouth: Mucous membranes are moist.     Pharynx: Oropharynx is clear.  Eyes:     Extraocular Movements: Extraocular movements intact.     Conjunctiva/sclera: Conjunctivae normal.     Pupils: Pupils are equal, round, and reactive to light.  Neck:     Vascular: No carotid bruit.  Cardiovascular:     Rate and Rhythm: Normal rate and regular rhythm.     Pulses: Normal pulses.     Heart sounds: Normal heart sounds.  Pulmonary:     Effort: Pulmonary effort is normal.     Breath sounds: Normal breath sounds.  Abdominal:     General: Bowel sounds are normal. There is no distension.     Palpations: Abdomen is soft. There is no mass.     Tenderness: There is no abdominal tenderness.  Musculoskeletal:        General: Normal range of motion.     Cervical back: Normal range of motion and neck supple. No tenderness.  Lymphadenopathy:     Cervical: No cervical adenopathy.  Skin:    General: Skin is warm and dry.     Capillary Refill: Capillary refill takes less than 2 seconds.  Neurological:     General: No focal deficit present.     Mental Status: He is alert and oriented to person, place, and time.     Sensory: No sensory deficit.     Motor: No weakness.  Psychiatric:        Mood and Affect: Mood normal.        Behavior: Behavior normal.      ASSESSMENT & PLAN: David Hays was seen today for headache.  Diagnoses and all orders for this visit:  Routine general medical examination at a health care facility  Intractable episodic cluster headache -     ZOLMitriptan (ZOMIG) 2.5 MG  tablet; Take 1 tablet (2.5 mg total) by mouth once for 1 dose. May repeat in 2 hours if headache persists or recurs.  Screening for deficiency anemia -     CBC with Differential/Platelet  Screening for lipoid disorders -     Lipid panel  Screening for endocrine, metabolic and immunity disorder -     Comprehensive metabolic panel -     TSH    Patient Instructions  If you have lab work done today you will be contacted with your lab results within the next 2 weeks.  If you have not heard from us then please contact us. The fastest way to get your results is to register for My Chart.   IF you received an x-ray today, you will receive an invoice from [facility]. Please contact [facility] at [phone] with questions or concerns regarding your invoice.   IF you received labwork today, you will receive an invoice from [facility]. Please contact [facility] at [phone] with questions or concerns regarding your invoice.   Our billing staff will not be able to assist you with questions regarding bills from these companies.  You will be contacted with the lab results as soon as they are available. The fastest way to get your results is to activate your My Chart account. Instructions are located on the last page of this paperwork. If you have not heard from us regarding the results in 2 weeks, please contact this office.      Health Maintenance, Male Adopting a healthy lifestyle and getting preventive care are important in promoting health and wellness. Ask your health care provider about:  The right schedule for you to have regular tests and exams.  Things you can do on your own to prevent diseases and keep yourself healthy. What should I know about diet, weight, and exercise? Eat a healthy diet   Eat a diet that includes plenty of vegetables, fruits, low-fat dairy products, and lean protein.  Do not eat a lot of foods that are high in solid fats, added  sugars, or sodium. Maintain a healthy weight Body mass index (BMI) is a measurement that can be used to identify possible weight problems. It estimates body fat based on height and weight. Your health care provider can help determine your BMI and help you achieve or maintain a healthy weight. Get regular exercise Get regular exercise. This is one of the most important things you can do for your health. Most adults should:  Exercise for at least 150 minutes each week. The exercise should increase your heart rate and make you sweat (moderate-intensity exercise).  Do strengthening exercises at least twice a week. This is in addition to the moderate-intensity exercise.  Spend less time sitting. Even light physical activity can be beneficial. Watch cholesterol and blood lipids Have your blood tested for lipids and cholesterol at 44 years of age, then have this test every 5 years. You may need to have your cholesterol levels checked more often if:  Your lipid or cholesterol levels are high.  You are older than 44 years of age.  You are at high risk for heart disease. What should I know about cancer screening? Many types of cancers can be detected early and may often be prevented. Depending on your health history and family history, you may need to have cancer screening at various ages. This may include screening for:  Colorectal cancer.  Prostate cancer.  Skin cancer.  Lung cancer. What should I know about heart disease, diabetes, and high blood pressure? Blood pressure and heart disease  High blood pressure causes heart disease and increases the risk of stroke. This is more likely to develop in people who have high blood pressure readings, are of African descent, or are overweight.  Talk with your health care provider about your target blood pressure readings.  Have your blood pressure checked: ? Every 3-5 years if you are 5718-44 years of age. ?  Every year if you are 55 years old or  older.  If you are between the ages of 83 and 74 and are a current or former smoker, ask your health care provider if you should have a one-time screening for abdominal aortic aneurysm (AAA). Diabetes Have regular diabetes screenings. This checks your fasting blood sugar level. Have the screening done:  Once every three years after age 57 if you are at a normal weight and have a low risk for diabetes.  More often and at a younger age if you are overweight or have a high risk for diabetes. What should I know about preventing infection? Hepatitis B If you have a higher risk for hepatitis B, you should be screened for this virus. Talk with your health care provider to find out if you are at risk for hepatitis B infection. Hepatitis C Blood testing is recommended for:  Everyone born from [birth year] through 1965.  Anyone with known risk factors for hepatitis C. Sexually transmitted infections (STIs)  You should be screened each year for STIs, including gonorrhea and chlamydia, if: ? You are sexually active and are younger than 44 years of age. ? You are older than 44 years of age and your health care provider tells you that you are at risk for this type of infection. ? Your sexual activity has changed since you were last screened, and you are at increased risk for chlamydia or gonorrhea. Ask your health care provider if you are at risk.  Ask your health care provider about whether you are at high risk for HIV. Your health care provider may recommend a prescription medicine to help prevent HIV infection. If you choose to take medicine to prevent HIV, you should first get tested for HIV. You should then be tested every 3 months for as long as you are taking the medicine. Follow these instructions at home: Lifestyle  Do not use any products that contain nicotine or tobacco, such as cigarettes, e-cigarettes, and chewing tobacco. If you need help quitting, ask your health care provider.  Do not use  street drugs.  Do not share needles.  Ask your health care provider for help if you need support or information about quitting drugs. Alcohol use  Do not drink alcohol if your health care provider tells you not to drink.  If you drink alcohol: ? Limit how much you have to 0-2 drinks a day. ? Be aware of how much alcohol is in your drink. In the [place] one drink equals one 12 oz bottle of beer (355 mL), one 5 oz glass of wine (148 mL), or one 1 oz glass of hard liquor (44 mL). General instructions  Schedule regular health, dental, and eye exams.  Stay current with your vaccines.  Tell your health care provider if: ? You often feel depressed. ? You have ever been abused or do not feel safe at home. Summary  Adopting a healthy lifestyle and getting preventive care are important in promoting health and wellness.  Follow your health care provider's instructions about healthy diet, exercising, and getting tested or screened for diseases.  Follow your health care provider's instructions on monitoring your cholesterol and blood pressure. This information is not intended to replace advice given to you by your health care provider. Make sure you discuss any questions you have with your health care provider. Document Revised: 05/02/2018 Document Reviewed: 05/02/2018 Elsevier Patient Education  2020 Elsevier Inc.  Cluster Headache Cluster headaches are deeply painful. They normally  occur on one side of your head, but they may switch sides. Often, cluster headaches:  Are severe.  Happen often for a few weeks or months and then go away for a while.  Last from 15 minutes to 3 hours.  Happen at the same time each day.  Happen at night.  Happen many times a day. Follow these instructions at home:        Follow instructions from your doctor to care for yourself at home:  Go to bed at the same time each night. Get the same amount of sleep every night.  Avoid alcohol.  Stop  smoking if you smoke. This includes cigarettes and e-cigarettes.  Take over-the-counter and prescription medicines only as told by your doctor.  Do not drive or use heavy machinery while taking prescription pain medicine.  Use oxygen as told by your doctor.  Exercise regularly.  Eat a healthy diet.  Write down when each headache happened, what kind of pain you had, how bad your pain was, and what you tried to help your pain. This is called a headache diary. Use it as told by your doctor. Contact a doctor if:  Your headaches get worse or they happen more often.  Your medicines are not helping. Get help right away if:  You pass out (faint).  You get weak or lose feeling (have numbness) on one side of your body or face.  You see two of everything (double vision).  You feel sick to your stomach (nauseous) or you throw up (vomit), and you do not stop after many hours.  You have trouble with your balance or with walking.  You have trouble talking.  You have neck pain or stiffness.  You have a fever. This information is not intended to replace advice given to you by your health care provider. Make sure you discuss any questions you have with your health care provider. Document Revised: 03/06/2019 Document Reviewed: 01/15/2016 Elsevier Patient Education  [place].  Hypertension, Adult High blood pressure (hypertension) is when the force of blood pumping through the arteries is too strong. The arteries are the blood vessels that carry blood from the heart throughout the body. Hypertension forces the heart to work harder to pump blood and may cause arteries to become narrow or stiff. Untreated or uncontrolled hypertension can cause a heart attack, heart failure, a stroke, kidney disease, and other problems. A blood pressure reading consists of a higher number over a lower number. Ideally, your blood pressure should be below 120/80. The first ("top") number is called the  systolic pressure. It is a measure of the pressure in your arteries as your heart beats. The second ("bottom") number is called the diastolic pressure. It is a measure of the pressure in your arteries as the heart relaxes. What are the causes? The exact cause of this condition is not known. There are some conditions that result in or are related to high blood pressure. What increases the risk? Some risk factors for high blood pressure are under your control. The following factors may make you more likely to develop this condition:  Smoking.  Having type 2 diabetes mellitus, high cholesterol, or both.  Not getting enough exercise or physical activity.  Being overweight.  Having too much fat, sugar, calories, or salt (sodium) in your diet.  Drinking too much alcohol. Some risk factors for high blood pressure may be difficult or impossible to change. Some of these factors include:  Having chronic kidney disease.  Having a family history of high blood pressure.  Age. Risk increases with age.  Race. You may be at higher risk if you are African American.  Gender. Men are at higher risk than women before age 90. After age 39, women are at higher risk than men.  Having obstructive sleep apnea.  Stress. What are the signs or symptoms? High blood pressure may not cause symptoms. Very high blood pressure (hypertensive crisis) may cause:  Headache.  Anxiety.  Shortness of breath.  Nosebleed.  Nausea and vomiting.  Vision changes.  Severe chest pain.  Seizures. How is this diagnosed? This condition is diagnosed by measuring your blood pressure while you are seated, with your arm resting on a flat surface, your legs uncrossed, and your feet flat on the floor. The cuff of the blood pressure monitor will be placed directly against the skin of your upper arm at the level of your heart. It should be measured at least twice using the same arm. Certain conditions can cause a difference  in blood pressure between your right and left arms. Certain factors can cause blood pressure readings to be lower or higher than normal for a short period of time:  When your blood pressure is higher when you are in a health care provider's office than when you are at home, this is called white coat hypertension. Most people with this condition do not need medicines.  When your blood pressure is higher at home than when you are in a health care provider's office, this is called masked hypertension. Most people with this condition may need medicines to control blood pressure. If you have a high blood pressure reading during one visit or you have normal blood pressure with other risk factors, you may be asked to:  Return on a different day to have your blood pressure checked again.  Monitor your blood pressure at home for 1 week or longer. If you are diagnosed with hypertension, you may have other blood or imaging tests to help your health care provider understand your overall risk for other conditions. How is this treated? This condition is treated by making healthy lifestyle changes, such as eating healthy foods, exercising more, and reducing your alcohol intake. Your health care provider may prescribe medicine if lifestyle changes are not enough to get your blood pressure under control, and if:  Your systolic blood pressure is above 130.  Your diastolic blood pressure is above 80. Your personal target blood pressure may vary depending on your medical conditions, your age, and other factors. Follow these instructions at home: Eating and drinking   Eat a diet that is high in fiber and potassium, and low in sodium, added sugar, and fat. An example eating plan is called the DASH (Dietary Approaches to Stop Hypertension) diet. To eat this way: ? Eat plenty of fresh fruits and vegetables. Try to fill one half of your plate at each meal with fruits and vegetables. ? Eat whole grains, such as  whole-wheat pasta, brown rice, or whole-grain bread. Fill about one fourth of your plate with whole grains. ? Eat or drink low-fat dairy products, such as skim milk or low-fat yogurt. ? Avoid fatty cuts of meat, processed or cured meats, and poultry with skin. Fill about one fourth of your plate with lean proteins, such as fish, chicken without skin, beans, eggs, or tofu. ? Avoid pre-made and processed foods. These tend to be higher in sodium, added sugar, and fat.  Reduce your daily sodium  intake. Most people with hypertension should eat less than 1,500 mg of sodium a day.  Do not drink alcohol if: ? Your health care provider tells you not to drink. ? You are pregnant, may be pregnant, or are planning to become pregnant.  If you drink alcohol: ? Limit how much you use to:  0-1 drink a day for women.  0-2 drinks a day for men. ? Be aware of how much alcohol is in your drink. In the [place] one drink equals one 12 oz bottle of beer (355 mL), one 5 oz glass of wine (148 mL), or one 1 oz glass of hard liquor (44 mL). Lifestyle   Work with your health care provider to maintain a healthy body weight or to lose weight. Ask what an ideal weight is for you.  Get at least 30 minutes of exercise most days of the week. Activities may include walking, swimming, or biking.  Include exercise to strengthen your muscles (resistance exercise), such as Pilates or lifting weights, as part of your weekly exercise routine. Try to do these types of exercises for 30 minutes at least 3 days a week.  Do not use any products that contain nicotine or tobacco, such as cigarettes, e-cigarettes, and chewing tobacco. If you need help quitting, ask your health care provider.  Monitor your blood pressure at home as told by your health care provider.  Keep all follow-up visits as told by your health care provider. This is important. Medicines  Take over-the-counter and prescription medicines only as told by your  health care provider. Follow directions carefully. Blood pressure medicines must be taken as prescribed.  Do not skip doses of blood pressure medicine. Doing this puts you at risk for problems and can make the medicine less effective.  Ask your health care provider about side effects or reactions to medicines that you should watch for. Contact a health care provider if you:  Think you are having a reaction to a medicine you are taking.  Have headaches that keep coming back (recurring).  Feel dizzy.  Have swelling in your ankles.  Have trouble with your vision. Get help right away if you:  Develop a severe headache or confusion.  Have unusual weakness or numbness.  Feel faint.  Have severe pain in your chest or abdomen.  Vomit repeatedly.  Have trouble breathing. Summary  Hypertension is when the force of blood pumping through your arteries is too strong. If this condition is not controlled, it may put you at risk for serious complications.  Your personal target blood pressure may vary depending on your medical conditions, your age, and other factors. For most people, a normal blood pressure is less than 120/80.  Hypertension is treated with lifestyle changes, medicines, or a combination of both. Lifestyle changes include losing weight, eating a healthy, low-sodium diet, exercising more, and limiting alcohol. This information is not intended to replace advice given to you by your health care provider. Make sure you discuss any questions you have with your health care provider. Document Revised: 01/17/2018 Document Reviewed: 01/17/2018 Elsevier Patient Education  2020 Else[employer] Inc.      Edwina Barth, MD [facility]

## 2019-09-25 ENCOUNTER — Encounter: Payer: Self-pay | Admitting: Emergency Medicine

## 2019-09-25 LAB — CBC WITH DIFFERENTIAL/PLATELET
Basophils Absolute: 0 10*3/uL (ref 0.0–0.2)
Basos: 0 %
EOS (ABSOLUTE): 0.1 10*3/uL (ref 0.0–0.4)
Eos: 1 %
Hematocrit: 41.3 % (ref 37.5–51.0)
Hemoglobin: 13.7 g/dL (ref 13.0–17.7)
Immature Grans (Abs): 0 10*3/uL (ref 0.0–0.1)
Immature Granulocytes: 0 %
Lymphocytes Absolute: 2.1 10*3/uL (ref 0.7–3.1)
Lymphs: 34 %
MCH: 30.4 pg (ref 26.6–33.0)
MCHC: 33.2 g/dL (ref 31.5–35.7)
MCV: 92 fL (ref 79–97)
Monocytes Absolute: 0.4 10*3/uL (ref 0.1–0.9)
Monocytes: 7 %
Neutrophils Absolute: 3.6 10*3/uL (ref 1.4–7.0)
Neutrophils: 58 %
Platelets: 135 10*3/uL — ABNORMAL LOW (ref 150–450)
RBC: 4.5 x10E6/uL (ref 4.14–5.80)
RDW: 12.8 % (ref 11.6–15.4)
WBC: 6.2 10*3/uL (ref 3.4–10.8)

## 2019-09-25 LAB — COMPREHENSIVE METABOLIC PANEL
ALT: 19 IU/L (ref 0–44)
AST: 13 IU/L (ref 0–40)
Albumin/Globulin Ratio: 1.7 (ref 1.2–2.2)
Albumin: 4.4 g/dL (ref 4.0–5.0)
Alkaline Phosphatase: 67 IU/L (ref 39–117)
BUN/Creatinine Ratio: 19 (ref 9–20)
BUN: 14 mg/dL (ref 6–24)
Bilirubin Total: 0.5 mg/dL (ref 0.0–1.2)
CO2: 24 mmol/L (ref 20–29)
Calcium: 9.2 mg/dL (ref 8.7–10.2)
Chloride: 105 mmol/L (ref 96–106)
Creatinine, Ser: 0.72 mg/dL — ABNORMAL LOW (ref 0.76–1.27)
GFR calc Af Amer: 132 mL/min/{1.73_m2} (ref >59)
GFR calc non Af Amer: 114 mL/min/{1.73_m2} (ref >59)
Globulin, Total: 2.6 g/dL (ref 1.5–4.5)
Glucose: 85 mg/dL (ref 65–99)
Potassium: 4.4 mmol/L (ref 3.5–5.2)
Sodium: 142 mmol/L (ref 134–144)
Total Protein: 7 g/dL (ref 6.0–8.5)

## 2019-09-25 LAB — TSH: TSH: 1.28 u[IU]/mL (ref 0.450–4.500)

## 2019-09-25 LAB — LIPID PANEL
Chol/HDL Ratio: 3.3 ratio (ref 0.0–5.0)
Cholesterol, Total: 148 mg/dL (ref 100–199)
HDL: 45 mg/dL (ref >39)
LDL Chol Calc (NIH): 94 mg/dL (ref 0–99)
Triglycerides: 36 mg/dL (ref 0–149)
VLDL Cholesterol Cal: 9 mg/dL (ref 5–40)

## 2019-10-03 ENCOUNTER — Other Ambulatory Visit: Payer: Self-pay | Admitting: Emergency Medicine

## 2019-10-03 DIAGNOSIS — G44011 Episodic cluster headache, intractable: Secondary | ICD-10-CM

## 2019-10-25 ENCOUNTER — Encounter: Payer: Self-pay | Admitting: General Practice

## 2021-11-18 ENCOUNTER — Ambulatory Visit (INDEPENDENT_AMBULATORY_CARE_PROVIDER_SITE_OTHER): Payer: No Typology Code available for payment source | Admitting: Emergency Medicine

## 2021-11-18 ENCOUNTER — Encounter: Payer: Self-pay | Admitting: Emergency Medicine

## 2021-11-18 VITALS — BP 102/80 | HR 101 | Temp 97.9°F | Ht 75.0 in | Wt 222.5 lb

## 2021-11-18 DIAGNOSIS — Z1159 Encounter for screening for other viral diseases: Secondary | ICD-10-CM | POA: Diagnosis not present

## 2021-11-18 DIAGNOSIS — Z130 Encounter for screening for diseases of the blood and blood-forming organs and certain disorders involving the immune mechanism: Secondary | ICD-10-CM

## 2021-11-18 DIAGNOSIS — Z13228 Encounter for screening for other metabolic disorders: Secondary | ICD-10-CM | POA: Diagnosis not present

## 2021-11-18 DIAGNOSIS — Z0001 Encounter for general adult medical examination with abnormal findings: Secondary | ICD-10-CM | POA: Diagnosis not present

## 2021-11-18 DIAGNOSIS — Z13220 Encounter for screening for lipoid disorders: Secondary | ICD-10-CM

## 2021-11-18 DIAGNOSIS — Z1329 Encounter for screening for other suspected endocrine disorder: Secondary | ICD-10-CM

## 2021-11-18 DIAGNOSIS — I480 Paroxysmal atrial fibrillation: Secondary | ICD-10-CM | POA: Diagnosis not present

## 2021-11-18 LAB — LIPID PANEL
Cholesterol: 165 mg/dL (ref 0–200)
HDL: 46.9 mg/dL (ref >39.00)
LDL Cholesterol: 106 mg/dL — ABNORMAL HIGH (ref 0–99)
NonHDL: 118.1
Total CHOL/HDL Ratio: 4
Triglycerides: 59 mg/dL (ref 0.0–149.0)
VLDL: 11.8 mg/dL (ref 0.0–40.0)

## 2021-11-18 LAB — CBC WITH DIFFERENTIAL/PLATELET
Basophils Absolute: 0 10*3/uL (ref 0.0–0.1)
Basophils Relative: 0.2 % (ref 0.0–3.0)
Eosinophils Absolute: 0.1 10*3/uL (ref 0.0–0.7)
Eosinophils Relative: 0.9 % (ref 0.0–5.0)
HCT: 41.2 % (ref 39.0–52.0)
Hemoglobin: 13.8 g/dL (ref 13.0–17.0)
Lymphocytes Relative: 27.6 % (ref 12.0–46.0)
Lymphs Abs: 2.2 10*3/uL (ref 0.7–4.0)
MCHC: 33.5 g/dL (ref 30.0–36.0)
MCV: 90.5 fl (ref 78.0–100.0)
Monocytes Absolute: 0.5 10*3/uL (ref 0.1–1.0)
Monocytes Relative: 6.4 % (ref 3.0–12.0)
Neutro Abs: 5.1 10*3/uL (ref 1.4–7.7)
Neutrophils Relative %: 64.9 % (ref 43.0–77.0)
Platelets: 138 10*3/uL — ABNORMAL LOW (ref 150.0–400.0)
RBC: 4.56 Mil/uL (ref 4.22–5.81)
RDW: 13.2 % (ref 11.5–15.5)
WBC: 7.8 10*3/uL (ref 4.0–10.5)

## 2021-11-18 LAB — COMPREHENSIVE METABOLIC PANEL
ALT: 16 U/L (ref 0–53)
AST: 18 U/L (ref 0–37)
Albumin: 4.5 g/dL (ref 3.5–5.2)
Alkaline Phosphatase: 50 U/L (ref 39–117)
BUN: 14 mg/dL (ref 6–23)
CO2: 30 mEq/L (ref 19–32)
Calcium: 9.6 mg/dL (ref 8.4–10.5)
Chloride: 104 mEq/L (ref 96–112)
Creatinine, Ser: 0.74 mg/dL (ref 0.40–1.50)
GFR: 109.33 mL/min (ref >60.00)
Glucose, Bld: 93 mg/dL (ref 70–99)
Potassium: 4.2 mEq/L (ref 3.5–5.1)
Sodium: 141 mEq/L (ref 135–145)
Total Bilirubin: 0.9 mg/dL (ref 0.2–1.2)
Total Protein: 7.5 g/dL (ref 6.0–8.3)

## 2021-11-18 LAB — HEMOGLOBIN A1C: Hgb A1c MFr Bld: 5.6 % (ref 4.6–6.5)

## 2021-11-18 NOTE — Progress Notes (Signed)
David Hays 46 y.o.   Chief Complaint  Patient presents with   Annual Exam    No concerns    HISTORY OF PRESENT ILLNESS: This is a 46 y.o. male A1A here for his annual exam. Doing well. Has history of atrial fibrillation first diagnosed in 2018 by me. Evaluated then by cardiologist.  No medications recommended. Asymptomatic.  Healthy lifestyle. No complaints or medical concerns today.  HPI   Prior to Admission medications   Medication Sig Start Date End Date Taking? Authorizing Provider  doxycycline (VIBRAMYCIN) 100 MG capsule Take 100 mg by mouth 2 (two) times daily.   Yes [provider]  finasteride (PROPECIA) 1 MG tablet Take 1 mg by mouth daily. 10/19/21   [provider]  metroNIDAZOLE (METROCREAM) 0.75 % cream Apply 1 application topically 2 (two) times daily. Patient not taking: Reported on 11/18/2021    [provider]  ZOLMitriptan (ZOMIG) 2.5 MG tablet Take 1 tablet (2.5 mg total) by mouth once for 1 dose. May repeat in 2 hours if headache persists or recurs. 09/24/19 09/24/19  Georgina Quint, MD    No Known Allergies  Patient Active Problem List   Diagnosis Date Noted   Headache, cluster    New onset atrial fibrillation (HCC) 10/13/2016    Past Medical History:  Diagnosis Date   Headache, cluster    Kidney stone     Past Surgical History:  Procedure Laterality Date   SPINE SURGERY     THUMB ARTHROSCOPY      Social History   Socioeconomic History   Marital status: Married    Spouse name: Not on file   Number of children: 0   Years of education: Not on file   Highest education level: Not on file  Occupational History   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  Tobacco Use   Smoking status: Never   Smokeless tobacco: Never  Substance and Sexual Activity   Alcohol use: Yes   Drug use: No   Sexual activity: Yes  Other Topics Concern   Not on file  Social History Narrative   Not on file   Social Determinants of  Health   Financial Resource Strain: Not on file  Food Insecurity: Not on file  Transportation Needs: Not on file  Physical Activity: Not on file  Stress: Not on file  Social Connections: Not on file  Intimate Partner Violence: Not on file    Family History  Problem Relation Age of Onset   Arrhythmia Mother    Hyperlipidemia Father      Review of Systems  Constitutional: Negative.  Negative for chills and fever.  HENT: Negative.  Negative for congestion and sore throat.   Respiratory: Negative.  Negative for shortness of breath.   Cardiovascular:  Positive for palpitations. Negative for chest pain.  Gastrointestinal:  Negative for abdominal pain, nausea and vomiting.  Genitourinary: Negative.   Musculoskeletal: Negative.   Skin: Negative.  Negative for rash.  Neurological: Negative.  Negative for dizziness, loss of consciousness and headaches.  All other systems reviewed and are negative.  Today's Vitals   11/18/21 1346  BP: 102/80  Pulse: (!) 101  Temp: 97.9 F (36.6 C)  TempSrc: Oral  SpO2: 99%  Weight: 222 lb 8 oz (100.9 kg)  Height: 6\' 3"  (1.905 m)   Body mass index is 27.81 kg/m.   Physical Exam Vitals reviewed.  Constitutional:      Appearance: Normal appearance.  HENT:     Head:  Normocephalic.     Right Ear: Tympanic membrane, ear canal and external ear normal.     Left Ear: Tympanic membrane, ear canal and external ear normal.     Mouth/Throat:     Mouth: Mucous membranes are moist.     Pharynx: Oropharynx is clear.  Eyes:     Extraocular Movements: Extraocular movements intact.     Conjunctiva/sclera: Conjunctivae normal.     Pupils: Pupils are equal, round, and reactive to light.  Cardiovascular:     Rate and Rhythm: Normal rate. Rhythm irregular.     Pulses: Normal pulses.     Heart sounds: Normal heart sounds.  Pulmonary:     Effort: Pulmonary effort is normal.     Breath sounds: Normal breath sounds.  Abdominal:     Palpations: Abdomen  is soft.     Tenderness: There is no abdominal tenderness.  Musculoskeletal:        General: Normal range of motion.     Cervical back: No tenderness.     Right lower leg: No edema.     Left lower leg: No edema.  Lymphadenopathy:     Cervical: No cervical adenopathy.  Skin:    General: Skin is warm and dry.  Neurological:     General: No focal deficit present.     Mental Status: He is alert and oriented to person, place, and time.  Psychiatric:        Mood and Affect: Mood normal.        Behavior: Behavior normal.    EKG: Atrial fibrillation with ventricular rate of 99/min.  No acute ischemic changes.  ASSESSMENT & PLAN: Problem List Items Addressed This Visit   None Visit Diagnoses     Encounter for general adult medical examination with abnormal findings    -  Primary   Need for hepatitis C screening test       Relevant Orders   Hepatitis C antibody screen   Screening for deficiency anemia       Relevant Orders   CBC with Differential   Screening for lipoid disorders       Relevant Orders   Lipid panel   Screening for endocrine, metabolic and immunity disorder       Relevant Orders   Comprehensive metabolic panel   Hemoglobin A1c   Paroxysmal atrial fibrillation (HCC)       Relevant Orders   Thyroid Panel With TSH   Ambulatory referral to Cardiology      Modifiable risk factors discussed with patient. Anticipatory guidance according to age provided. The following topics were also discussed: Social Determinants of Health Smoking.  Non-smoker Diet and nutrition.  Advised to decrease amount of daily carbohydrate intake. Benefits of exercise Cancer screening and need for colon cancer screening with colonoscopy Vaccinations recommendations Cardiovascular risk assessment Diagnosis of atrial fibrillation and need for cardiology evaluation Mental health including depression and anxiety Fall and accident prevention  Patient Instructions  Health Maintenance,  Male Adopting a healthy lifestyle and getting preventive care are important in promoting health and wellness. Ask your health care provider about: The right schedule for you to have regular tests and exams. Things you can do on your own to prevent diseases and keep yourself healthy. What should I know about diet, weight, and exercise? Eat a healthy diet  Eat a diet that includes plenty of vegetables, fruits, low-fat dairy products, and lean protein. Do not eat a lot of foods that are high in solid fats,  added sugars, or sodium. Maintain a healthy weight Body mass index (BMI) is a measurement that can be used to identify possible weight problems. It estimates body fat based on height and weight. Your health care provider can help determine your BMI and help you achieve or maintain a healthy weight. Get regular exercise Get regular exercise. This is one of the most important things you can do for your health. Most adults should: Exercise for at least 150 minutes each week. The exercise should increase your heart rate and make you sweat (moderate-intensity exercise). Do strengthening exercises at least twice a week. This is in addition to the moderate-intensity exercise. Spend less time sitting. Even light physical activity can be beneficial. Watch cholesterol and blood lipids Have your blood tested for lipids and cholesterol at 46 years of age, then have this test every 5 years. You may need to have your cholesterol levels checked more often if: Your lipid or cholesterol levels are high. You are older than 46 years of age. You are at high risk for heart disease. What should I know about cancer screening? Many types of cancers can be detected early and may often be prevented. Depending on your health history and family history, you may need to have cancer screening at various ages. This may include screening for: Colorectal cancer. Prostate cancer. Skin cancer. Lung cancer. What should I  know about heart disease, diabetes, and high blood pressure? Blood pressure and heart disease High blood pressure causes heart disease and increases the risk of stroke. This is more likely to develop in people who have high blood pressure readings or are overweight. Talk with your health care provider about your target blood pressure readings. Have your blood pressure checked: Every 3-5 years if you are 4118-46 years of age. Every year if you are [address]04 years old or older. If you are between the ages of 6965 and 6575 and are a current or former smoker, ask your health care provider if you should have a one-time screening for abdominal aortic aneurysm (AAA). Diabetes Have regular diabetes screenings. This checks your fasting blood sugar level. Have the screening done: Once every three years after age 46 if you are at a normal weight and have a low risk for diabetes. More often and at a younger age if you are overweight or have a high risk for diabetes. What should I know about preventing infection? Hepatitis B If you have a higher risk for hepatitis B, you should be screened for this virus. Talk with your health care provider to find out if you are at risk for hepatitis B infection. Hepatitis C Blood testing is recommended for: Everyone born from [id number] through 1965. Anyone with known risk factors for hepatitis C. Sexually transmitted infections (STIs) You should be screened each year for STIs, including gonorrhea and chlamydia, if: You are sexually active and are younger than 46 years of age. You are older than 46 years of age and your health care provider tells you that you are at risk for this type of infection. Your sexual activity has changed since you were last screened, and you are at increased risk for chlamydia or gonorrhea. Ask your health care provider if you are at risk. Ask your health care provider about whether you are at high risk for HIV. Your health care provider may recommend a  prescription medicine to help prevent HIV infection. If you choose to take medicine to prevent HIV, you should first get tested for HIV. You  should then be tested every 3 months for as long as you are taking the medicine. Follow these instructions at home: Alcohol use Do not drink alcohol if your health care provider tells you not to drink. If you drink alcohol: Limit how much you have to 0-2 drinks a day. Know how much alcohol is in your drink. In the [place] one drink equals one 12 oz bottle of beer (355 mL), one 5 oz glass of wine (148 mL), or one 1 oz glass of hard liquor (44 mL). Lifestyle Do not use any products that contain nicotine or tobacco. These products include cigarettes, chewing tobacco, and vaping devices, such as e-cigarettes. If you need help quitting, ask your health care provider. Do not use street drugs. Do not share needles. Ask your health care provider for help if you need support or information about quitting drugs. General instructions Schedule regular health, dental, and eye exams. Stay current with your vaccines. Tell your health care provider if: You often feel depressed. You have ever been abused or do not feel safe at home. Summary Adopting a healthy lifestyle and getting preventive care are important in promoting health and wellness. Follow your health care provider's instructions about healthy diet, exercising, and getting tested or screened for diseases. Follow your health care provider's instructions on monitoring your cholesterol and blood pressure. This information is not intended to replace advice given to you by your health care provider. Make sure you discuss any questions you have with your health care provider. Document Revised: 09/28/2020 Document Reviewed: 09/28/2020 Elsevier Patient Education  2023 [employer] Atrial Fibrillation  Atrial fibrillation is a type of heartbeat that is irregular or fast. If you have this condition, your heart beats  without any order. This makes it hard for your heart to pump blood in a normal way. Atrial fibrillation may come and go, or it may become a long-lasting problem. If this condition is not treated, it can put you at higher risk for stroke, heart failure, and other heart problems. What are the causes? This condition may be caused by diseases that damage the heart. They include: High blood pressure. Heart failure. Heart valve disease. Heart surgery. Other causes include: Diabetes. Thyroid disease. Being overweight. Kidney disease. Sometimes the cause is not known. What increases the risk? You are more likely to develop this condition if: You are older. You smoke. You exercise often and very hard. You have a family history of this condition. You are a man. You use drugs. You drink a lot of alcohol. You have lung conditions, such as emphysema, pneumonia, or COPD. You have sleep apnea. What are the signs or symptoms? Common symptoms of this condition include: A feeling that your heart is beating very fast. Chest pain or discomfort. Feeling short of breath. Suddenly feeling light-headed or weak. Getting tired easily during activity. Fainting. Sweating. In some cases, there are no symptoms. How is this treated? Treatment for this condition depends on underlying conditions and how you feel when you have atrial fibrillation. They include: Medicines to: Prevent blood clots. Treat heart rate or heart rhythm problems. Using devices, such as a pacemaker, to correct heart rhythm problems. Doing surgery to remove the part of the heart that sends bad signals. Closing an area where clots can form in the heart (left atrial appendage). In some cases, your doctor will treat other underlying conditions. Follow these instructions at home: Medicines Take over-the-counter and prescription medicines only as told by your doctor. Do not take  any new medicines without first talking to your  doctor. If you are taking blood thinners: Talk with your doctor before you take any medicines that have aspirin or NSAIDs, such as ibuprofen, in them. Take your medicine exactly as told by your doctor. Take it at the same time each day. Avoid activities that could hurt or bruise you. Follow instructions about how to prevent falls. Wear a bracelet that says you are taking blood thinners. Or, carry a card that lists what medicines you take. Lifestyle     Do not use any products that have nicotine or tobacco in them. These include cigarettes, e-cigarettes, and chewing tobacco. If you need help quitting, ask your doctor. Eat heart-healthy foods. Talk with your doctor about the right eating plan for you. Exercise regularly as told by your doctor. Do not drink alcohol. Lose weight if you are overweight. Do not use drugs, including cannabis. General instructions If you have a condition that causes breathing to stop for a short period of time (apnea), treat it as told by your doctor. Keep a healthy weight. Do not use diet pills unless your doctor says they are safe for you. Diet pills may make heart problems worse. Keep all follow-up visits as told by your doctor. This is important. Contact a doctor if: You notice a change in the speed, rhythm, or strength of your heartbeat. You are taking a blood-thinning medicine and you get more bruising. You get tired more easily when you move or exercise. You have a sudden change in weight. Get help right away if:  You have pain in your chest or your belly (abdomen). You have trouble breathing. You have side effects of blood thinners, such as blood in your vomit, poop (stool), or pee (urine), or bleeding that cannot stop. You have any signs of a stroke. "BE FAST" is an easy way to remember the main warning signs: B - Balance. Signs are dizziness, sudden trouble walking, or loss of balance. E - Eyes. Signs are trouble seeing or a change in how you see. F  - Face. Signs are sudden weakness or loss of feeling in the face, or the face or eyelid drooping on one side. A - Arms. Signs are weakness or loss of feeling in an arm. This happens suddenly and usually on one side of the body. S - Speech. Signs are sudden trouble speaking, slurred speech, or trouble understanding what people say. T - Time. Time to call emergency services. Write down what time symptoms started. You have other signs of a stroke, such as: A sudden, very bad headache with no known cause. Feeling like you may vomit (nausea). Vomiting. A seizure. These symptoms may be an emergency. Do not wait to see if the symptoms will go away. Get medical help right away. Call your local emergency services (911 in the [place] Do not drive yourself to the hospital. Summary Atrial fibrillation is a type of heartbeat that is irregular or fast. You are at higher risk of this condition if you smoke, are older, have diabetes, or are overweight. Follow your doctor's instructions about medicines, diet, exercise, and follow-up visits. Get help right away if you have signs or symptoms of a stroke. Get help right away if you cannot catch your breath, or you have chest pain or discomfort. This information is not intended to replace advice given to you by your health care provider. Make sure you discuss any questions you have with your health care provider. Document Revised: 10/31/2018  Document Reviewed: 10/31/2018 Elsevier Patient Education  2023 [employer]    Edwina Barth, MD [facility]

## 2021-11-18 NOTE — Patient Instructions (Addendum)
Health Maintenance, Male Adopting a healthy lifestyle and getting preventive care are important in promoting health and wellness. Ask your health care provider about: The right schedule for you to have regular tests and exams. Things you can do on your own to prevent diseases and keep yourself healthy. What should I know about diet, weight, and exercise? Eat a healthy diet  Eat a diet that includes plenty of vegetables, fruits, low-fat dairy products, and lean protein. Do not eat a lot of foods that are high in solid fats, added sugars, or sodium. Maintain a healthy weight Body mass index (BMI) is a measurement that can be used to identify possible weight problems. It estimates body fat based on height and weight. Your health care provider can help determine your BMI and help you achieve or maintain a healthy weight. Get regular exercise Get regular exercise. This is one of the most important things you can do for your health. Most adults should: Exercise for at least 150 minutes each week. The exercise should increase your heart rate and make you sweat (moderate-intensity exercise). Do strengthening exercises at least twice a week. This is in addition to the moderate-intensity exercise. Spend less time sitting. Even light physical activity can be beneficial. Watch cholesterol and blood lipids Have your blood tested for lipids and cholesterol at 46 years of age, then have this test every 5 years. You may need to have your cholesterol levels checked more often if: Your lipid or cholesterol levels are high. You are older than 46 years of age. You are at high risk for heart disease. What should I know about cancer screening? Many types of cancers can be detected early and may often be prevented. Depending on your health history and family history, you may need to have cancer screening at various ages. This may include screening for: Colorectal cancer. Prostate cancer. Skin cancer. Lung  cancer. What should I know about heart disease, diabetes, and high blood pressure? Blood pressure and heart disease High blood pressure causes heart disease and increases the risk of stroke. This is more likely to develop in people who have high blood pressure readings or are overweight. Talk with your health care provider about your target blood pressure readings. Have your blood pressure checked: Every 3-5 years if you are 18-39 years of age. Every year if you are 40 years old or older. If you are between the ages of 65 and 75 and are a current or former smoker, ask your health care provider if you should have a one-time screening for abdominal aortic aneurysm (AAA). Diabetes Have regular diabetes screenings. This checks your fasting blood sugar level. Have the screening done: Once every three years after age 45 if you are at a normal weight and have a low risk for diabetes. More often and at a younger age if you are overweight or have a high risk for diabetes. What should I know about preventing infection? Hepatitis B If you have a higher risk for hepatitis B, you should be screened for this virus. Talk with your health care provider to find out if you are at risk for hepatitis B infection. Hepatitis C Blood testing is recommended for: Everyone born from 1945 through 1965. Anyone with known risk factors for hepatitis C. Sexually transmitted infections (STIs) You should be screened each year for STIs, including gonorrhea and chlamydia, if: You are sexually active and are younger than 46 years of age. You are older than 46 years of age and your   health care provider tells you that you are at risk for this type of infection. Your sexual activity has changed since you were last screened, and you are at increased risk for chlamydia or gonorrhea. Ask your health care provider if you are at risk. Ask your health care provider about whether you are at high risk for HIV. Your health care provider  may recommend a prescription medicine to help prevent HIV infection. If you choose to take medicine to prevent HIV, you should first get tested for HIV. You should then be tested every 3 months for as long as you are taking the medicine. Follow these instructions at home: Alcohol use Do not drink alcohol if your health care provider tells you not to drink. If you drink alcohol: Limit how much you have to 0-2 drinks a day. Know how much alcohol is in your drink. In the [place] one drink equals one 12 oz bottle of beer (355 mL), one 5 oz glass of wine (148 mL), or one 1 oz glass of hard liquor (44 mL). Lifestyle Do not use any products that contain nicotine or tobacco. These products include cigarettes, chewing tobacco, and vaping devices, such as e-cigarettes. If you need help quitting, ask your health care provider. Do not use street drugs. Do not share needles. Ask your health care provider for help if you need support or information about quitting drugs. General instructions Schedule regular health, dental, and eye exams. Stay current with your vaccines. Tell your health care provider if: You often feel depressed. You have ever been abused or do not feel safe at home. Summary Adopting a healthy lifestyle and getting preventive care are important in promoting health and wellness. Follow your health care provider's instructions about healthy diet, exercising, and getting tested or screened for diseases. Follow your health care provider's instructions on monitoring your cholesterol and blood pressure. This information is not intended to replace advice given to you by your health care provider. Make sure you discuss any questions you have with your health care provider. Document Revised: 09/28/2020 Document Reviewed: 09/28/2020 Elsevier Patient Education  2023 [employer] Atrial Fibrillation  Atrial fibrillation is a type of heartbeat that is irregular or fast. If you have this condition,  your heart beats without any order. This makes it hard for your heart to pump blood in a normal way. Atrial fibrillation may come and go, or it may become a long-lasting problem. If this condition is not treated, it can put you at higher risk for stroke, heart failure, and other heart problems. What are the causes? This condition may be caused by diseases that damage the heart. They include: High blood pressure. Heart failure. Heart valve disease. Heart surgery. Other causes include: Diabetes. Thyroid disease. Being overweight. Kidney disease. Sometimes the cause is not known. What increases the risk? You are more likely to develop this condition if: You are older. You smoke. You exercise often and very hard. You have a family history of this condition. You are a man. You use drugs. You drink a lot of alcohol. You have lung conditions, such as emphysema, pneumonia, or COPD. You have sleep apnea. What are the signs or symptoms? Common symptoms of this condition include: A feeling that your heart is beating very fast. Chest pain or discomfort. Feeling short of breath. Suddenly feeling light-headed or weak. Getting tired easily during activity. Fainting. Sweating. In some cases, there are no symptoms. How is this treated? Treatment for this condition depends on underlying  conditions and how you feel when you have atrial fibrillation. They include: Medicines to: Prevent blood clots. Treat heart rate or heart rhythm problems. Using devices, such as a pacemaker, to correct heart rhythm problems. Doing surgery to remove the part of the heart that sends bad signals. Closing an area where clots can form in the heart (left atrial appendage). In some cases, your doctor will treat other underlying conditions. Follow these instructions at home: Medicines Take over-the-counter and prescription medicines only as told by your doctor. Do not take any new medicines without first talking  to your doctor. If you are taking blood thinners: Talk with your doctor before you take any medicines that have aspirin or NSAIDs, such as ibuprofen, in them. Take your medicine exactly as told by your doctor. Take it at the same time each day. Avoid activities that could hurt or bruise you. Follow instructions about how to prevent falls. Wear a bracelet that says you are taking blood thinners. Or, carry a card that lists what medicines you take. Lifestyle     Do not use any products that have nicotine or tobacco in them. These include cigarettes, e-cigarettes, and chewing tobacco. If you need help quitting, ask your doctor. Eat heart-healthy foods. Talk with your doctor about the right eating plan for you. Exercise regularly as told by your doctor. Do not drink alcohol. Lose weight if you are overweight. Do not use drugs, including cannabis. General instructions If you have a condition that causes breathing to stop for a short period of time (apnea), treat it as told by your doctor. Keep a healthy weight. Do not use diet pills unless your doctor says they are safe for you. Diet pills may make heart problems worse. Keep all follow-up visits as told by your doctor. This is important. Contact a doctor if: You notice a change in the speed, rhythm, or strength of your heartbeat. You are taking a blood-thinning medicine and you get more bruising. You get tired more easily when you move or exercise. You have a sudden change in weight. Get help right away if:  You have pain in your chest or your belly (abdomen). You have trouble breathing. You have side effects of blood thinners, such as blood in your vomit, poop (stool), or pee (urine), or bleeding that cannot stop. You have any signs of a stroke. "BE FAST" is an easy way to remember the main warning signs: B - Balance. Signs are dizziness, sudden trouble walking, or loss of balance. E - Eyes. Signs are trouble seeing or a change in how you  see. F - Face. Signs are sudden weakness or loss of feeling in the face, or the face or eyelid drooping on one side. A - Arms. Signs are weakness or loss of feeling in an arm. This happens suddenly and usually on one side of the body. S - Speech. Signs are sudden trouble speaking, slurred speech, or trouble understanding what people say. T - Time. Time to call emergency services. Write down what time symptoms started. You have other signs of a stroke, such as: A sudden, very bad headache with no known cause. Feeling like you may vomit (nausea). Vomiting. A seizure. These symptoms may be an emergency. Do not wait to see if the symptoms will go away. Get medical help right away. Call your local emergency services (911 in the [place] Do not drive yourself to the hospital. Summary Atrial fibrillation is a type of heartbeat that is irregular or fast.  You are at higher risk of this condition if you smoke, are older, have diabetes, or are overweight. Follow your doctor's instructions about medicines, diet, exercise, and follow-up visits. Get help right away if you have signs or symptoms of a stroke. Get help right away if you cannot catch your breath, or you have chest pain or discomfort. This information is not intended to replace advice given to you by your health care provider. Make sure you discuss any questions you have with your health care provider. Document Revised: 10/31/2018 Document Reviewed: 10/31/2018 Elsevier Patient Education  2023 [employer].

## 2021-11-18 NOTE — Addendum Note (Signed)
Addended by: Jerrell Belfast on: 11/18/2021 03:40 PM   Modules accepted: Orders

## 2021-11-19 LAB — THYROID PANEL WITH TSH
Free Thyroxine Index: 2.7 (ref 1.4–3.8)
T3 Uptake: 33 % (ref 22–35)
T4, Total: 8.1 ug/dL (ref 4.9–10.5)
TSH: 1.52 mIU/L (ref 0.40–4.50)

## 2021-11-19 LAB — HEPATITIS C ANTIBODY: Hepatitis C Ab: NONREACTIVE

## 2021-11-28 NOTE — Progress Notes (Signed)
Cardiology Office Note:    Date:  11/28/2021   ID:  David Hays, DOB 18-Jan-1976, MRN [id number]  PCP:  Georgina Quint, MD  Cardiologist:  None  Electrophysiologist:  None   Referring MD: Georgina Quint, *   No chief complaint on file. ***  History of Present Illness:    David Hays is a 46 y.o. male with a hx of atrial fibrillation who is referred by Dr. Alvy Bimler for evaluation of atrial fibrillation.  He previously followed with Dr. [place], last seen in 2018.  Echocardiogram 10/27/2016 showed EF 55 to 60%, normal RV function, no significant valvular disease.  Past Medical History:  Diagnosis Date   Headache, cluster    Kidney stone     Past Surgical History:  Procedure Laterality Date   SPINE SURGERY     THUMB ARTHROSCOPY      Current Medications: No outpatient medications have been marked as taking for the 12/01/21 encounter (Appointment) with Little Ishikawa, MD.     Allergies:   Patient has no known allergies.   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  Tobacco Use   Smoking status: Never   Smokeless tobacco: Never  Substance and Sexual Activity   Alcohol use: Yes   Drug use: No   Sexual activity: Yes  Other Topics Concern   Not on file  Social History Narrative   Not on file   Social Determinants of Health   Financial Resource Strain: Not on file  Food Insecurity: Not on file  Transportation Needs: Not on file  Physical Activity: Not on file  Stress: Not on file  Social Connections: Not on file     Family History: The patient's ***family history includes Arrhythmia in his mother; Hyperlipidemia in his father.  ROS:   Please see the history of present illness.    *** All other systems reviewed and are  negative.  EKGs/Labs/Other Studies Reviewed:    The following studies were reviewed today: ***  EKG:  EKG is *** ordered today.  The ekg ordered today demonstrates ***  Recent Labs: 11/18/2021: ALT 16; BUN 14; Creatinine, Ser 0.74; Hemoglobin 13.8; Platelets 138.0; Potassium 4.2; Sodium 141; TSH 1.52  Recent Lipid Panel    Component Value Date/Time   CHOL 165 11/18/2021 1445   CHOL 148 09/24/2019 1138   TRIG 59.0 11/18/2021 1445   HDL 46.90 11/18/2021 1445   HDL 45 09/24/2019 1138   CHOLHDL 4 11/18/2021 1445   VLDL 11.8 11/18/2021 1445   LDLCALC 106 (H) 11/18/2021 1445   LDLCALC 94 09/24/2019 1138    Physical Exam:    VS:  There were no vitals taken for this visit.    Wt Readings from Last 3 Encounters:  11/18/21 222 lb 8 oz (100.9 kg)  09/24/19 221 lb (100.2 kg)  01/15/18 216 lb (98 kg)     GEN: *** Well nourished, well developed in no acute distress HEENT: Normal NECK: No JVD; No carotid bruits LYMPHATICS: No lymphadenopathy CARDIAC: ***RRR, no murmurs, rubs, gallops RESPIRATORY:  Clear to auscultation without rales, wheezing or rhonchi  ABDOMEN: Soft, non-tender, non-distended MUSCULOSKELETAL:  No edema; No deformity  SKIN: Warm and dry NEUROLOGIC:  Alert and oriented x 3 PSYCHIATRIC:  Normal affect   ASSESSMENT:    No diagnosis found. PLAN:    Paroxysmal atrial fibrillation: CHA2DS2-VASc score 0, not on anticoagulation.  Echocardiogram 10/27/2016 showed EF 55 to 60%, normal RV function, no significant valvular disease.  RTC in***  Medication Adjustments/Labs and Tests Ordered: Current medicines are reviewed at length with the patient today.  Concerns regarding medicines are outlined above.  No orders of the defined types were placed in this encounter.  No orders of the defined types were placed in this encounter.   There are no Patient Instructions on file for this visit.   Signed, Little Ishikawa, MD  11/28/2021 5:06 PM    [facility]

## 2021-12-01 ENCOUNTER — Ambulatory Visit (INDEPENDENT_AMBULATORY_CARE_PROVIDER_SITE_OTHER): Payer: No Typology Code available for payment source | Admitting: Cardiology

## 2021-12-01 ENCOUNTER — Encounter: Payer: Self-pay | Admitting: Cardiology

## 2021-12-01 ENCOUNTER — Ambulatory Visit (INDEPENDENT_AMBULATORY_CARE_PROVIDER_SITE_OTHER): Payer: No Typology Code available for payment source

## 2021-12-01 VITALS — BP 108/64 | HR 83 | Ht 76.0 in | Wt 221.2 lb

## 2021-12-01 DIAGNOSIS — R0683 Snoring: Secondary | ICD-10-CM | POA: Diagnosis not present

## 2021-12-01 DIAGNOSIS — I4819 Other persistent atrial fibrillation: Secondary | ICD-10-CM | POA: Diagnosis not present

## 2021-12-01 DIAGNOSIS — R400 Somnolence: Secondary | ICD-10-CM

## 2021-12-01 MED ORDER — APIXABAN 5 MG PO TABS: 5.0000 mg | ORAL_TABLET | Freq: Two times a day (BID) | ORAL | 3 refills | Status: DC

## 2021-12-01 NOTE — Progress Notes (Unsigned)
Enrolled for [employer] to mail a ZIO XT long term holter monitor to the patients address on file.  

## 2021-12-01 NOTE — Patient Instructions (Signed)
Medication Instructions:  START Eliquis 5 mg two times daily  *If you need a refill on your cardiac medications before your next appointment, please call your pharmacy*  Testing/Procedures: Your physician has requested that you have an echocardiogram. Echocardiography is a painless test that uses sound waves to create images of your heart. It provides your doctor with information about the size and shape of your heart and how well your heart's chambers and valves are working. This procedure takes approximately one hour. There are no restrictions for this procedure.  Your physician has recommended that you have a sleep study. This test records several body functions during sleep, including: brain activity, eye movement, oxygen and carbon dioxide blood levels, heart rate and rhythm, breathing rate and rhythm, the flow of air through your mouth and nose, snoring, body muscle movements, and chest and belly movement.  ZIO XT- Long Term Monitor Instructions   Your physician has requested you wear a ZIO patch monitor for _3_ days.  This is a single patch monitor.   [employer] supplies one patch monitor per enrollment. Additional stickers are not available. Please do not apply patch if you will be having a Nuclear Stress Test, Echocardiogram, Cardiac CT, MRI, or Chest Xray during the period you would be wearing the monitor. The patch cannot be worn during these tests. You cannot remove and re-apply the ZIO XT patch monitor.  Your ZIO patch monitor will be sent Fed Ex from [employer] directly to your home address. It may take 3-5 days to receive your monitor after you have been enrolled.  Once you have received your monitor, please review the enclosed instructions. Your monitor has already been registered assigning a specific monitor serial # to you.  Billing and Patient Assistance Program Information   We have supplied [employer] with any of your insurance information on file for billing  purposes. [employer] offers a sliding scale Patient Assistance Program for patients that do not have insurance, or whose insurance does not completely cover the cost of the ZIO monitor.   You must apply for the Patient Assistance Program to qualify for this discounted rate.     To apply, please call [employer] at [phone], select option 4, then select option 2, and ask to apply for Patient Assistance Program.  Meredeth Ide will ask your household income, and how many people are in your household.  They will quote your out-of-pocket cost based on that information.  [employer] will also be able to set up a 67-month, interest-free payment plan if needed.  Applying the monitor   Shave hair from upper left chest.  Hold abrader disc by orange tab. Rub abrader in 40 strokes over the upper left chest as indicated in your monitor instructions.  Clean area with 4 enclosed alcohol pads. Let dry.  Apply patch as indicated in monitor instructions. Patch will be placed under collarbone on left side of chest with arrow pointing upward.  Rub patch adhesive wings for 2 minutes. Remove white label marked "1". Remove the white label marked "2". Rub patch adhesive wings for 2 additional minutes.  While looking in a mirror, press and release button in center of patch. A small green light will flash 3-4 times. This will be your only indicator that the monitor has been turned on. ?  Do not shower for the first 24 hours. You may shower after the first 24 hours.  Press the button if you feel a symptom. You will hear a small click. Record Date, Time and  Symptom in the Patient Logbook.  When you are ready to remove the patch, follow instructions on the last 2 pages of the Patient Logbook. Stick patch monitor onto the last page of Patient Logbook.  Place Patient Logbook in the blue and white box.  Use locking tab on box and tape box closed securely.  The blue and white box has prepaid postage on it. Please place it in the mailbox as soon  as possible. Your physician should have your test results approximately 7 days after the monitor has been mailed back to [facility].  Call [facility] Customer Care at [phone] if you have questions regarding your ZIO XT patch monitor. Call them immediately if you see an orange light blinking on your monitor.  If your monitor falls off in less than 4 days, contact our Monitor department at [phone]. ?If your monitor becomes loose or falls off after 4 days call [employer] at [phone] for suggestions on securing your monitor.?  Follow-Up: At [facility], you and your health needs are our priority.  As part of our continuing mission to provide you with exceptional heart care, we have created designated Provider Care Teams.  These Care Teams include your primary Cardiologist (physician) and Advanced Practice Providers (APPs -  Physician Assistants and Nurse Practitioners) who all work together to provide you with the care you need, when you need it.  We recommend signing up for the patient portal called "MyChart".  Sign up information is provided on this After Visit Summary.  MyChart is used to connect with patients for Virtual Visits (Telemedicine).  Patients are able to view lab/test results, encounter notes, upcoming appointments, etc.  Non-urgent messages can be sent to your provider as well.   To learn more about what you can do with MyChart, go to [email or web address].    Your next appointment:   2-3 week(s)  The format for your next appointment:   In Person  Provider:   Dr. Bjorn Pippin  Important Information About Sugar

## 2021-12-16 ENCOUNTER — Ambulatory Visit (HOSPITAL_COMMUNITY): Payer: PRIVATE HEALTH INSURANCE | Attending: Cardiology

## 2021-12-16 DIAGNOSIS — I4819 Other persistent atrial fibrillation: Secondary | ICD-10-CM | POA: Diagnosis present

## 2021-12-16 LAB — ECHOCARDIOGRAM COMPLETE: S' Lateral: 2.9 cm

## 2021-12-19 NOTE — H&P (View-Only) (Signed)
Cardiology Office Note:    Date:  12/20/2021   ID:  David Hays, DOB 03/04/1976, MRN [id number]  PCP:  Sagardia, Miguel Jose, MD  Cardiologist:  None  Electrophysiologist:  None   Referring MD: Sagardia, Miguel Jose, *   Chief Complaint  Patient presents with   Atrial Fibrillation    History of Present Illness:    David Hays is a 46 y.o. male with a hx of atrial fibrillation who presents for follow-up.  He was referred by Dr. Sagardia for evaluation of atrial fibrillation, initially seen on 12/01/2021.  He previously followed with Dr. Jordan, last seen in 2018.  He was seen by Dr. Sagardia on 11/18/21, noted to be in A-fib.  Denies any chest pain, dyspnea, lower extremity edema.  Does report occasional lightheadedness with bending over, denies any syncope.  Reports occasional palpitations.  Reports he works long hours at his job and is under a fair amount of stress.  Has not been exercising.  Has been told he is snores and is tired throughout the day.  He smoked in late teens/early 20s.  No known history of heart disease in his immediate family.  Echocardiogram 10/27/2016 showed EF 55 to 60%, normal RV function, no significant valvular disease.  Echocardiogram 12/16/2021 showed normal biventricular function, no significant valvular disease.  Since last clinic visit, he reports that he has been doing okay.  Denies any chest pain, dyspnea, palpitations.  He is under a lot of stress at work.  Has missed multiple doses of Eliquis, including last night.  Denies any bleeding issues.   Past Medical History:  Diagnosis Date   Headache, cluster    Kidney stone     Past Surgical History:  Procedure Laterality Date   SPINE SURGERY     THUMB ARTHROSCOPY      Current Medications: Current Meds  Medication Sig   apixaban (ELIQUIS) 5 MG TABS tablet Take 1 tablet (5 mg total) by mouth 2 (two) times daily.   doxycycline (VIBRAMYCIN) 100 MG capsule Take 100 mg by mouth 2 (two) times  daily.   finasteride (PROPECIA) 1 MG tablet Take 1 mg by mouth daily.   metroNIDAZOLE (METROCREAM) 0.75 % cream Apply 1 application  topically 2 (two) times daily.   ZOLMitriptan (ZOMIG) 2.5 MG tablet Take 1 tablet (2.5 mg total) by mouth once for 1 dose. May repeat in 2 hours if headache persists or recurs.     Allergies:   Patient has no known allergies.   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  Tobacco Use   Smoking status: Never   Smokeless tobacco: Never  Substance and Sexual Activity   Alcohol use: Yes   Drug use: No   Sexual activity: Yes  Other Topics Concern   Not on file  Social History Narrative   Not on file   Social Determinants of Health   Financial Resource Strain: Not on file  Food Insecurity: Not on file  Transportation Needs: Not on file  Physical Activity: Not on file  Stress: Not on file  Social Connections: Not on file     Family History: The patient's family history includes Arrhythmia in his mother; Hyperlipidemia in his father.  ROS:   Please see   the history of present illness.     All other systems reviewed and are negative.  EKGs/Labs/Other Studies Reviewed:    The following studies were reviewed today:   EKG:   12/20/2021: A-fib, rate 96 12/01/21:afib, rate 83  Recent Labs: 11/18/2021: ALT 16; BUN 14; Creatinine, Ser 0.74; Hemoglobin 13.8; Platelets 138.0; Potassium 4.2; Sodium 141; TSH 1.52  Recent Lipid Panel    Component Value Date/Time   CHOL 165 11/18/2021 1445   CHOL 148 09/24/2019 1138   TRIG 59.0 11/18/2021 1445   HDL 46.90 11/18/2021 1445   HDL 45 09/24/2019 1138   CHOLHDL 4 11/18/2021 1445   VLDL 11.8 11/18/2021 1445   LDLCALC 106 (H) 11/18/2021 1445   LDLCALC 94 09/24/2019 1138    Physical Exam:    VS:  BP  126/80   Pulse 96   Ht 6' 4.5" (1.943 m)   Wt 221 lb 9.6 oz (100.5 kg)   SpO2 94%   BMI 26.62 kg/m     Wt Readings from Last 3 Encounters:  12/20/21 221 lb 9.6 oz (100.5 kg)  12/01/21 221 lb 3.2 oz (100.3 kg)  11/18/21 222 lb 8 oz (100.9 kg)     GEN:  Well nourished, well developed in no acute distress HEENT: Normal NECK: No JVD; No carotid bruits LYMPHATICS: No lymphadenopathy CARDIAC: Irregular, normal rate, no murmurs, rubs, gallops RESPIRATORY:  Clear to auscultation without rales, wheezing or rhonchi  ABDOMEN: Soft, non-tender, non-distended MUSCULOSKELETAL:  No edema; No deformity  SKIN: Warm and dry NEUROLOGIC:  Alert and oriented x 3 PSYCHIATRIC:  Normal affect   ASSESSMENT:    1. Persistent atrial fibrillation (HCC)   2. Snoring   3. Daytime somnolence     PLAN:    Persistent atrial fibrillation: CHA2DS2-VASc score 0, not on anticoagulation.  Noted to be in A-fib at PCP visit 6/29.  Remains in A-fib today.  Echocardiogram 12/16/2021 showed normal biventricular function, no significant valvular disease. -Rates appear controlled despite no AV nodal blocking agents.  Check Zio patch x3 days to evaluate rate control -Reports snoring/daytime somnolence, will check sleep study to evaluate for OSA -Recommend restoring sinus rhythm given his young age.  Started Eliquis 5 mg twice daily and plan was to schedule cardioversion now that he has been on for 3 weeks.  However he reports multiple missed doses, including yesterday evening.  Reiterated that he must be on Eliquis for 3 weeks with no missed doses before we can do cardioversion.  Cardioversion tentatively scheduled for 8/23 -CHA2DS2-VASc score 0, will not need long-term anticoagulation but will need to be on Eliquis for 1 month post cardioversion. -Would recommend referral to EP for evaluation for ablation  Snoring/daytime somnolence: Check sleep study  RTC in 1 month  Medication Adjustments/Labs and Tests  Ordered: Current medicines are reviewed at length with the patient today.  Concerns regarding medicines are outlined above.  Orders Placed This Encounter  Procedures   ELECTRICAL CARDIOVERSION   CBC   Basic metabolic panel   Ambulatory referral to Cardiac Electrophysiology   EKG 12-Lead   No orders of the defined types were placed in this encounter.   Patient Instructions  Medication Instructions:  RESUME eliquis -- twice daily   *If you need a refill on your cardiac medications before your next appointment, please call your pharmacy*   Lab Work: Non-Fasting BMET and CBC prior to Cardioversion   If you have labs (blood work) drawn today and your tests are completely normal, you will  receive your results only by: MyChart Message (if you have MyChart) OR A paper copy in the mail If you have any lab test that is abnormal or we need to change your treatment, we will call you to review the results.   Testing/Procedures: Please wear Zio monitor for 3 days as ordered  Cardioversion at [facility] on Wednesday 01/12/22 with Dr. Royann Shivers   Follow-Up: At [facility], you and your health needs are our priority.  As part of our continuing mission to provide you with exceptional heart care, we have created designated Provider Care Teams.  These Care Teams include your primary Cardiologist (physician) and Advanced Practice Providers (APPs -  Physician Assistants and Nurse Practitioners) who all work together to provide you with the care you need, when you need it.  We recommend signing up for the patient portal called "MyChart".  Sign up information is provided on this After Visit Summary.  MyChart is used to connect with patients for Virtual Visits (Telemedicine).  Patients are able to view lab/test results, encounter notes, upcoming appointments, etc.  Non-urgent messages can be sent to your provider as well.   To learn more about what you can do with MyChart, go to  [email or web address].    Your next appointment:   August 30th with Dr. Bjorn Pippin at 10am   You have been referred to Dr. Loman Brooklyn or Dr. Steffanie Dunn (cardiac electrophysiologist) for evaluation of atrial fibrillation ablation    Other Instructions  You are scheduled for a Cardioversion on January 12, 2022 with Dr. Royann Shivers.  Please arrive at the [facility] (Main Entrance A) at [facility]: [address] [place], [place] [address] at 12 noon - procedure time approx. 1pm  DIET: Nothing to eat or drink after midnight except a sip of water with medications (see medication instructions below)  FYI: For your safety, and to allow [place] to monitor your vital signs accurately during the surgery/procedure we request that   if you have artificial nails, gel coating, SNS etc. Please have those removed prior to your surgery/procedure. Not having the nail coverings /polish removed may result in cancellation or delay of your surgery/procedure.   Medication Instructions: Hold N/A  Continue your anticoagulant: Eliquis You will need to continue your anticoagulant after your procedure until you  are told by your  Provider that it is safe to stop   Labs: Non-Fasting BMET and CBC about 1 week before procedure at any [facility]  You must have a responsible person to drive you home and stay in the waiting area during your procedure. Failure to do so could result in cancellation.  Bring your insurance cards.  *Special Note: Every effort is made to have your procedure done on time. Occasionally there are emergencies that occur at the hospital that may cause delays. Please be patient if a delay does occur.     Signed, Little Ishikawa, MD  12/20/2021 12:51 PM    [facility]

## 2021-12-19 NOTE — Progress Notes (Unsigned)
Cardiology Office Note:    Date:  12/20/2021   ID:  RICKARDO Hays, DOB [birth year], MRN [id number]  PCP:  Horald Pollen, MD  Cardiologist:  None  Electrophysiologist:  None   Referring MD: Horald Pollen, *   Chief Complaint  Patient presents with   Atrial Fibrillation    History of Present Illness:    David Hays is a 46 y.o. male with a hx of atrial fibrillation who presents for follow-up.  He was referred by Dr. Mitchel Honour for evaluation of atrial fibrillation, initially seen on 12/01/2021.  He previously followed with Dr. Martinique, last seen in 2018.  He was seen by Dr. Mitchel Honour on 11/18/21, noted to be in A-fib.  Denies any chest pain, dyspnea, lower extremity edema.  Does report occasional lightheadedness with bending over, denies any syncope.  Reports occasional palpitations.  Reports he works long hours at his job and is under a fair amount of stress.  Has not been exercising.  Has been told he is snores and is tired throughout the day.  He smoked in late teens/early 20s.  No known history of heart disease in his immediate family.  Echocardiogram 10/27/2016 showed EF 55 to 60%, normal RV function, no significant valvular disease.  Echocardiogram 12/16/2021 showed normal biventricular function, no significant valvular disease.  Since last clinic visit, he reports that he has been doing okay.  Denies any chest pain, dyspnea, palpitations.  He is under a lot of stress at work.  Has missed multiple doses of Eliquis, including last night.  Denies any bleeding issues.   Past Medical History:  Diagnosis Date   Headache, cluster    Kidney stone     Past Surgical History:  Procedure Laterality Date   SPINE SURGERY     THUMB ARTHROSCOPY      Current Medications: Current Meds  Medication Sig   apixaban (ELIQUIS) 5 MG TABS tablet Take 1 tablet (5 mg total) by mouth 2 (two) times daily.   doxycycline (VIBRAMYCIN) 100 MG capsule Take 100 mg by mouth 2 (two) times  daily.   finasteride (PROPECIA) 1 MG tablet Take 1 mg by mouth daily.   metroNIDAZOLE (METROCREAM) 0.75 % cream Apply 1 application  topically 2 (two) times daily.   ZOLMitriptan (ZOMIG) 2.5 MG tablet Take 1 tablet (2.5 mg total) by mouth once for 1 dose. May repeat in 2 hours if headache persists or recurs.     Allergies:   Patient has no known allergies.   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  Tobacco Use   Smoking status: Never   Smokeless tobacco: Never  Substance and Sexual Activity   Alcohol use: Yes   Drug use: No   Sexual activity: Yes  Other Topics Concern   Not on file  Social History Narrative   Not on file   Social Determinants of Health   Financial Resource Strain: Not on file  Food Insecurity: Not on file  Transportation Needs: Not on file  Physical Activity: Not on file  Stress: Not on file  Social Connections: Not on file     Family History: The patient's family history includes Arrhythmia in his mother; Hyperlipidemia in his father.  ROS:   Please see  the history of present illness.     All other systems reviewed and are negative.  EKGs/Labs/Other Studies Reviewed:    The following studies were reviewed today:   EKG:   12/20/2021: A-fib, rate 96 12/01/21:afib, rate 83  Recent Labs: 11/18/2021: ALT 16; BUN 14; Creatinine, Ser 0.74; Hemoglobin 13.8; Platelets 138.0; Potassium 4.2; Sodium 141; TSH 1.52  Recent Lipid Panel    Component Value Date/Time   CHOL 165 11/18/2021 1445   CHOL 148 09/24/2019 1138   TRIG 59.0 11/18/2021 1445   HDL 46.90 11/18/2021 1445   HDL 45 09/24/2019 1138   CHOLHDL 4 11/18/2021 1445   VLDL 11.8 11/18/2021 1445   LDLCALC 106 (H) 11/18/2021 1445   LDLCALC 94 09/24/2019 1138    Physical Exam:    VS:  BP  126/80   Pulse 96   Ht 6' 4.5" (1.943 m)   Wt 221 lb 9.6 oz (100.5 kg)   SpO2 94%   BMI 26.62 kg/m     Wt Readings from Last 3 Encounters:  12/20/21 221 lb 9.6 oz (100.5 kg)  12/01/21 221 lb 3.2 oz (100.3 kg)  11/18/21 222 lb 8 oz (100.9 kg)     GEN:  Well nourished, well developed in no acute distress HEENT: Normal NECK: No JVD; No carotid bruits LYMPHATICS: No lymphadenopathy CARDIAC: Irregular, normal rate, no murmurs, rubs, gallops RESPIRATORY:  Clear to auscultation without rales, wheezing or rhonchi  ABDOMEN: Soft, non-tender, non-distended MUSCULOSKELETAL:  No edema; No deformity  SKIN: Warm and dry NEUROLOGIC:  Alert and oriented x 3 PSYCHIATRIC:  Normal affect   ASSESSMENT:    1. Persistent atrial fibrillation (HCC)   2. Snoring   3. Daytime somnolence     PLAN:    Persistent atrial fibrillation: CHA2DS2-VASc score 0, not on anticoagulation.  Noted to be in A-fib at PCP visit 6/29.  Remains in A-fib today.  Echocardiogram 12/16/2021 showed normal biventricular function, no significant valvular disease. -Rates appear controlled despite no AV nodal blocking agents.  Check Zio patch x3 days to evaluate rate control -Reports snoring/daytime somnolence, will check sleep study to evaluate for OSA -Recommend restoring sinus rhythm given his young age.  Started Eliquis 5 mg twice daily and plan was to schedule cardioversion now that he has been on for 3 weeks.  However he reports multiple missed doses, including yesterday evening.  Reiterated that he must be on Eliquis for 3 weeks with no missed doses before we can do cardioversion.  Cardioversion tentatively scheduled for 8/23 -CHA2DS2-VASc score 0, will not need long-term anticoagulation but will need to be on Eliquis for 1 month post cardioversion. -Would recommend referral to EP for evaluation for ablation  Snoring/daytime somnolence: Check sleep study  RTC in 1 month  Medication Adjustments/Labs and Tests  Ordered: Current medicines are reviewed at length with the patient today.  Concerns regarding medicines are outlined above.  Orders Placed This Encounter  Procedures   ELECTRICAL CARDIOVERSION   CBC   Basic metabolic panel   Ambulatory referral to Cardiac Electrophysiology   EKG 12-Lead   No orders of the defined types were placed in this encounter.   Patient Instructions  Medication Instructions:  RESUME eliquis -- twice daily   *If you need a refill on your cardiac medications before your next appointment, please call your pharmacy*   Lab Work: Non-Fasting BMET and CBC prior to Cardioversion   If you have labs (blood work) drawn today and your tests are completely normal, you will  receive your results only by: MyChart Message (if you have MyChart) OR A paper copy in the mail If you have any lab test that is abnormal or we need to change your treatment, we will call you to review the results.   Testing/Procedures: Please wear Zio monitor for 3 days as ordered  Cardioversion at [facility] on Wednesday 01/12/22 with Dr. Royann Shivers   Follow-Up: At [facility], you and your health needs are our priority.  As part of our continuing mission to provide you with exceptional heart care, we have created designated Provider Care Teams.  These Care Teams include your primary Cardiologist (physician) and Advanced Practice Providers (APPs -  Physician Assistants and Nurse Practitioners) who all work together to provide you with the care you need, when you need it.  We recommend signing up for the patient portal called "MyChart".  Sign up information is provided on this After Visit Summary.  MyChart is used to connect with patients for Virtual Visits (Telemedicine).  Patients are able to view lab/test results, encounter notes, upcoming appointments, etc.  Non-urgent messages can be sent to your provider as well.   To learn more about what you can do with MyChart, go to  [email or web address].    Your next appointment:   August 30th with Dr. Bjorn Pippin at 10am   You have been referred to Dr. Loman Brooklyn or Dr. Steffanie Dunn (cardiac electrophysiologist) for evaluation of atrial fibrillation ablation    Other Instructions  You are scheduled for a Cardioversion on January 12, 2022 with Dr. Royann Shivers.  Please arrive at the [facility] (Main Entrance A) at [facility]: [address] [place], [place] [address] at 12 noon - procedure time approx. 1pm  DIET: Nothing to eat or drink after midnight except a sip of water with medications (see medication instructions below)  FYI: For your safety, and to allow [place] to monitor your vital signs accurately during the surgery/procedure we request that   if you have artificial nails, gel coating, SNS etc. Please have those removed prior to your surgery/procedure. Not having the nail coverings /polish removed may result in cancellation or delay of your surgery/procedure.   Medication Instructions: Hold N/A  Continue your anticoagulant: Eliquis You will need to continue your anticoagulant after your procedure until you  are told by your  Provider that it is safe to stop   Labs: Non-Fasting BMET and CBC about 1 week before procedure at any [facility]  You must have a responsible person to drive you home and stay in the waiting area during your procedure. Failure to do so could result in cancellation.  Bring your insurance cards.  *Special Note: Every effort is made to have your procedure done on time. Occasionally there are emergencies that occur at the hospital that may cause delays. Please be patient if a delay does occur.     Signed, Little Ishikawa, MD  12/20/2021 12:51 PM    [facility]

## 2021-12-20 ENCOUNTER — Ambulatory Visit (INDEPENDENT_AMBULATORY_CARE_PROVIDER_SITE_OTHER): Payer: No Typology Code available for payment source | Admitting: Cardiology

## 2021-12-20 ENCOUNTER — Encounter: Payer: Self-pay | Admitting: Cardiology

## 2021-12-20 VITALS — BP 126/80 | HR 96 | Ht 76.5 in | Wt 221.6 lb

## 2021-12-20 DIAGNOSIS — R400 Somnolence: Secondary | ICD-10-CM

## 2021-12-20 DIAGNOSIS — R0683 Snoring: Secondary | ICD-10-CM | POA: Diagnosis not present

## 2021-12-20 DIAGNOSIS — I4819 Other persistent atrial fibrillation: Secondary | ICD-10-CM | POA: Diagnosis not present

## 2021-12-20 NOTE — Patient Instructions (Signed)
Medication Instructions:  RESUME eliquis -- twice daily   *If you need a refill on your cardiac medications before your next appointment, please call your pharmacy*   Lab Work: Non-Fasting BMET and CBC prior to Cardioversion   If you have labs (blood work) drawn today and your tests are completely normal, you will receive your results only by: MyChart Message (if you have MyChart) OR A paper copy in the mail If you have any lab test that is abnormal or we need to change your treatment, we will call you to review the results.   Testing/Procedures: Please wear Zio monitor for 3 days as ordered  Cardioversion at [facility] on Wednesday 01/12/22 with Dr. Royann Shivers   Follow-Up: At [facility], you and your health needs are our priority.  As part of our continuing mission to provide you with exceptional heart care, we have created designated Provider Care Teams.  These Care Teams include your primary Cardiologist (physician) and Advanced Practice Providers (APPs -  Physician Assistants and Nurse Practitioners) who all work together to provide you with the care you need, when you need it.  We recommend signing up for the patient portal called "MyChart".  Sign up information is provided on this After Visit Summary.  MyChart is used to connect with patients for Virtual Visits (Telemedicine).  Patients are able to view lab/test results, encounter notes, upcoming appointments, etc.  Non-urgent messages can be sent to your provider as well.   To learn more about what you can do with MyChart, go to [email or web address].    Your next appointment:   August 30th with Dr. Bjorn Pippin at 10am   You have been referred to Dr. Loman Brooklyn or Dr. Steffanie Dunn (cardiac electrophysiologist) for evaluation of atrial fibrillation ablation    Other Instructions  You are scheduled for a Cardioversion on January 12, 2022 with Dr. Royann Shivers.  Please arrive at the [facility] (Main Entrance A) at [facility]: [address] [place], [place] [address] at 12 noon - procedure time approx. 1pm  DIET: Nothing to eat or drink after midnight except a sip of water with medications (see medication instructions below)  FYI: For your safety, and to allow [place] to monitor your vital signs accurately during the surgery/procedure we request that   if you have artificial nails, gel coating, SNS etc. Please have those removed prior to your surgery/procedure. Not having the nail coverings /polish removed may result in cancellation or delay of your surgery/procedure.   Medication Instructions: Hold N/A  Continue your anticoagulant: Eliquis You will need to continue your anticoagulant after your procedure until you  are told by your  Provider that it is safe to stop   Labs: Non-Fasting BMET and CBC about 1 week before procedure at any [facility]  You must have a responsible person to drive you home and stay in the waiting area during your procedure. Failure to do so could result in cancellation.  Bring your insurance cards.  *Special Note: Every effort is made to have your procedure done on time. Occasionally there are emergencies that occur at the hospital that may cause delays. Please be patient if a delay does occur.

## 2021-12-21 DIAGNOSIS — I4819 Other persistent atrial fibrillation: Secondary | ICD-10-CM | POA: Diagnosis not present

## 2021-12-24 ENCOUNTER — Telehealth: Payer: Self-pay | Admitting: *Deleted

## 2021-12-24 NOTE — Telephone Encounter (Signed)
Prior Authorization for split night sleep study sent to [facility] via Phone.  No PA is required.Reference # [id number].

## 2022-01-06 ENCOUNTER — Encounter (HOSPITAL_COMMUNITY): Payer: Self-pay | Admitting: Cardiovascular Disease

## 2022-01-06 LAB — BASIC METABOLIC PANEL
BUN/Creatinine Ratio: 16 (ref 9–20)
BUN: 13 mg/dL (ref 6–24)
CO2: 25 mmol/L (ref 20–29)
Calcium: 8.8 mg/dL (ref 8.7–10.2)
Chloride: 102 mmol/L (ref 96–106)
Creatinine, Ser: 0.79 mg/dL (ref 0.76–1.27)
Glucose: 74 mg/dL (ref 70–99)
Potassium: 4.4 mmol/L (ref 3.5–5.2)
Sodium: 140 mmol/L (ref 134–144)
eGFR: 112 mL/min/{1.73_m2} (ref >59)

## 2022-01-06 LAB — CBC
Hematocrit: 39.6 % (ref 37.5–51.0)
Hemoglobin: 13.4 g/dL (ref 13.0–17.7)
MCH: 29.8 pg (ref 26.6–33.0)
MCHC: 33.8 g/dL (ref 31.5–35.7)
MCV: 88 fL (ref 79–97)
Platelets: 164 10*3/uL (ref 150–450)
RBC: 4.49 x10E6/uL (ref 4.14–5.80)
RDW: 13 % (ref 11.6–15.4)
WBC: 6.8 10*3/uL (ref 3.4–10.8)

## 2022-01-11 ENCOUNTER — Other Ambulatory Visit: Payer: Self-pay | Admitting: Cardiovascular Disease

## 2022-01-11 DIAGNOSIS — I4819 Other persistent atrial fibrillation: Secondary | ICD-10-CM

## 2022-01-12 ENCOUNTER — Ambulatory Visit (HOSPITAL_BASED_OUTPATIENT_CLINIC_OR_DEPARTMENT_OTHER): Payer: No Typology Code available for payment source | Admitting: Anesthesiology

## 2022-01-12 ENCOUNTER — Ambulatory Visit (HOSPITAL_COMMUNITY)
Admission: RE | Admit: 2022-01-12 | Discharge: 2022-01-12 | Disposition: A | Discharge status: Home / Self Care | Payer: No Typology Code available for payment source | Attending: Cardiovascular Disease | Admitting: Cardiovascular Disease

## 2022-01-12 ENCOUNTER — Ambulatory Visit (HOSPITAL_COMMUNITY): Payer: No Typology Code available for payment source | Admitting: Anesthesiology

## 2022-01-12 ENCOUNTER — Encounter: Payer: Self-pay | Admitting: *Deleted

## 2022-01-12 ENCOUNTER — Other Ambulatory Visit: Payer: Self-pay

## 2022-01-12 ENCOUNTER — Encounter (HOSPITAL_COMMUNITY)
Admission: RE | Disposition: A | Discharge status: Home / Self Care | Payer: Self-pay | Source: Home / Self Care | Attending: Cardiovascular Disease

## 2022-01-12 DIAGNOSIS — I4891 Unspecified atrial fibrillation: Secondary | ICD-10-CM

## 2022-01-12 DIAGNOSIS — I4819 Other persistent atrial fibrillation: Secondary | ICD-10-CM | POA: Insufficient documentation

## 2022-01-12 DIAGNOSIS — Z7901 Long term (current) use of anticoagulants: Secondary | ICD-10-CM | POA: Diagnosis not present

## 2022-01-12 DIAGNOSIS — Z87891 Personal history of nicotine dependence: Secondary | ICD-10-CM | POA: Insufficient documentation

## 2022-01-12 HISTORY — PX: CARDIOVERSION: SHX1299

## 2022-01-12 SURGERY — CARDIOVERSION
Anesthesia: General

## 2022-01-12 MED ORDER — PROPOFOL 10 MG/ML IV BOLUS
INTRAVENOUS | Status: DC | PRN
  Administered 2022-01-12: 120 mg via INTRAVENOUS
  Administered 2022-01-12: 30 mg via INTRAVENOUS

## 2022-01-12 MED ORDER — LIDOCAINE 2% (20 MG/ML) 5 ML SYRINGE
INTRAMUSCULAR | Status: DC | PRN
  Administered 2022-01-12: 100 mg via INTRAVENOUS

## 2022-01-12 MED ORDER — SODIUM CHLORIDE 0.9 % IV SOLN
INTRAVENOUS | Status: AC | PRN
  Administered 2022-01-12: 500 mL via INTRAVENOUS

## 2022-01-12 NOTE — Transfer of Care (Signed)
Immediate Anesthesia Transfer of Care Note  Patient: David Hays  Procedure(s) Performed: CARDIOVERSION  Patient Location: PACU and Endoscopy Unit  Anesthesia Type:General  Level of Consciousness: drowsy and patient cooperative  Airway & Oxygen Therapy: Patient Spontanous Breathing and Patient connected to nasal cannula oxygen  Post-op Assessment: Report given to RN and Post -op Vital signs reviewed and stable  Post vital signs: Reviewed and stable  Last Vitals:  Vitals Value Taken Time  BP 119/83   Temp    Pulse 67   Resp 12   SpO2 100     Last Pain:  Vitals:   01/12/22 0959  PainSc: 0-No pain         Complications: No notable events documented.

## 2022-01-12 NOTE — Op Note (Signed)
Procedure: Electrical Cardioversion Indications:  Atrial Fibrillation  Procedure Details:  Consent: Risks of procedure as well as the alternatives and risks of each were explained to the (patient/caregiver).  Consent for procedure obtained.  Time Out: Verified patient identification, verified procedure, site/side was marked, verified correct patient position, special equipment/implants available, medications/allergies/relevent history reviewed, required imaging and test results available.  Performed  Patient placed on cardiac monitor, pulse oximetry, supplemental oxygen as necessary.  Sedation given:  150 mg propofol IV, Dr. Renold Don Pacer pads placed anterior and posterior chest.  Cardioverted 1 time(s).  Cardioversion with synchronized biphasic 120J shock.  Evaluation: Findings: Post procedure EKG shows: NSR Complications: None Patient did tolerate procedure well.  Time Spent Directly with the Patient:  30 minutes   Nieko Clarin 01/12/2022, 10:45 AM

## 2022-01-12 NOTE — Discharge Instructions (Signed)

## 2022-01-12 NOTE — Anesthesia Preprocedure Evaluation (Addendum)
Anesthesia Evaluation  Patient identified by MRN, date of birth, ID band Patient awake    Reviewed: Allergy & Precautions, NPO status , Patient's Chart, lab work & pertinent test results  Airway Mallampati: I  TM Distance: >3 FB Neck ROM: Full    Dental no notable dental hx. (+) Dental Advisory Given, Teeth Intact   Pulmonary neg pulmonary ROS,    Pulmonary exam normal breath sounds clear to auscultation       Cardiovascular Normal cardiovascular exam+ dysrhythmias Atrial Fibrillation + Valvular Problems/Murmurs MR  Rhythm:Regular Rate:Normal  Echo 11/2021 1. Left ventricular ejection fraction, by estimation, is 60 to 65%. The left ventricle has normal function. The left ventricle has no regional wall motion abnormalities. There is mild left ventricular hypertrophy of the basal-septal segment. Left ventricular diastolic parameters are indeterminate.  2. Right ventricular systolic function is normal. The right ventricular size is normal.  3. The mitral valve is normal in structure. Mild mitral valve  regurgitation. No evidence of mitral stenosis.  4. The aortic valve is normal in structure. Aortic valve regurgitation is not visualized. No aortic stenosis is present.  5. The inferior vena cava is normal in size with greater than 50% respiratory variability, suggesting right atrial pressure of 3 mmHg.    Neuro/Psych  Headaches,    GI/Hepatic negative GI ROS, Neg liver ROS,   Endo/Other  negative endocrine ROS  Renal/GU Renal disease     Musculoskeletal negative musculoskeletal ROS (+)   Abdominal   Peds  Hematology negative hematology ROS (+)   Anesthesia Other Findings   Reproductive/Obstetrics                           Anesthesia Physical Anesthesia Plan  ASA: 2  Anesthesia Plan: General   Post-op Pain Management: Minimal or no pain anticipated   Induction: Intravenous  PONV Risk  Score and Plan: 2 and Treatment may vary due to age or medical condition, Propofol infusion and TIVA  Airway Management Planned: Mask  Additional Equipment:   Intra-op Plan:   Post-operative Plan:   Informed Consent: I have reviewed the patients History and Physical, chart, labs and discussed the procedure including the risks, benefits and alternatives for the proposed anesthesia with the patient or authorized representative who has indicated his/her understanding and acceptance.     Dental advisory given  Plan Discussed with: CRNA  Anesthesia Plan Comments:        Anesthesia Quick Evaluation

## 2022-01-12 NOTE — Interval H&P Note (Signed)
History and Physical Interval Note:  01/12/2022 9:52 AM  David Hays  has presented today for surgery, with the diagnosis of AFIB.  The various methods of treatment have been discussed with the patient and family. After consideration of risks, benefits and other options for treatment, the patient has consented to  Procedure(s): CARDIOVERSION (N/A) as a surgical intervention.  The patient's history has been reviewed, patient examined, no change in status, stable for surgery.  I have reviewed the patient's chart and labs.  Questions were answered to the patient's satisfaction.     Gisel Vipond

## 2022-01-13 ENCOUNTER — Encounter (HOSPITAL_COMMUNITY): Payer: Self-pay | Admitting: Cardiovascular Disease

## 2022-01-13 NOTE — Anesthesia Postprocedure Evaluation (Signed)
Anesthesia Post Note  Patient: David Hays  Procedure(s) Performed: CARDIOVERSION     Patient location during evaluation: Endoscopy Anesthesia Type: General Level of consciousness: sedated and patient cooperative Pain management: pain level controlled Vital Signs Assessment: post-procedure vital signs reviewed and stable Respiratory status: spontaneous breathing Cardiovascular status: stable Anesthetic complications: no   No notable events documented.  Last Vitals:  Vitals:   01/12/22 1057 01/12/22 1100  BP: 113/81 113/81  Pulse: 64 65  Resp: 15 17  Temp:    SpO2: 100% 99%    Last Pain:  Vitals:   01/12/22 1100  TempSrc:   PainSc: 0-No pain                 Lewie Loron

## 2022-01-19 ENCOUNTER — Ambulatory Visit: Payer: No Typology Code available for payment source | Attending: Cardiology | Admitting: Cardiology

## 2022-01-19 ENCOUNTER — Encounter: Payer: Self-pay | Admitting: Cardiology

## 2022-01-19 VITALS — BP 128/88 | HR 59 | Ht 77.5 in | Wt 220.8 lb

## 2022-01-19 DIAGNOSIS — I4819 Other persistent atrial fibrillation: Secondary | ICD-10-CM | POA: Diagnosis not present

## 2022-01-19 DIAGNOSIS — R400 Somnolence: Secondary | ICD-10-CM

## 2022-01-19 DIAGNOSIS — R9431 Abnormal electrocardiogram [ECG] [EKG]: Secondary | ICD-10-CM | POA: Insufficient documentation

## 2022-01-19 DIAGNOSIS — R0683 Snoring: Secondary | ICD-10-CM

## 2022-01-19 NOTE — Patient Instructions (Signed)
Medication Instructions:  Take Eliquis until September 23rd, then ok to stop  *If you need a refill on your cardiac medications before your next appointment, please call your pharmacy*  Follow-Up: At [facility], you and your health needs are our priority.  As part of our continuing mission to provide you with exceptional heart care, we have created designated Provider Care Teams.  These Care Teams include your primary Cardiologist (physician) and Advanced Practice Providers (APPs -  Physician Assistants and Nurse Practitioners) who all work together to provide you with the care you need, when you need it.  We recommend signing up for the patient portal called "MyChart".  Sign up information is provided on this After Visit Summary.  MyChart is used to connect with patients for Virtual Visits (Telemedicine).  Patients are able to view lab/test results, encounter notes, upcoming appointments, etc.  Non-urgent messages can be sent to your provider as well.   To learn more about what you can do with MyChart, go to [email or web address].    Your next appointment:   6 month(s)  The format for your next appointment:   In Person  Provider:   Dr. Bjorn Pippin  Recommend [facility] --save EKG as attachment and send via Mychart message  Important Information About Sugar

## 2022-01-19 NOTE — Progress Notes (Signed)
Cardiology Office Note:    Date:  01/19/2022   ID:  David Hays, DOB 10-Jan-1976, MRN [id number]  PCP:  Georgina Quint, MD  Cardiologist:  None  Electrophysiologist:  None   Referring MD: Georgina Quint, *   Chief Complaint  Patient presents with   Atrial Fibrillation    History of Present Illness:    David Hays is a 46 y.o. male with a hx of atrial fibrillation who presents for follow-up.  He was referred by Dr. Alvy Bimler for evaluation of atrial fibrillation, initially seen on 12/01/2021.  He previously followed with Dr. [place], last seen in 2018.  He was seen by Dr. Alvy Bimler on 11/18/21, noted to be in A-fib.  Denies any chest pain, dyspnea, lower extremity edema.  Does report occasional lightheadedness with bending over, denies any syncope.  Reports occasional palpitations.  Reports he works long hours at his job and is under a fair amount of stress.  Has not been exercising.  Has been told he is snores and is tired throughout the day.  He smoked in late teens/early 20s.  Mother has Afib, has ICD.   Echocardiogram 10/27/2016 showed EF 55 to 60%, normal RV function, no significant valvular disease.  Echocardiogram 12/16/2021 showed normal biventricular function, no significant valvular disease.  Zio patch x3 days 12/30/2021 showed 100%  A-fib burden, average rate 109 bpm.  Underwent successful cardioversion 01/12/2022.  Since last clinic visit, he reports that he is doing well.  Feels improved since his cardioversion, less fatigued.  Denies any palpitations.  No bleeding on Eliquis.    Past Medical History:  Diagnosis Date   Headache, cluster    Kidney stone     Past Surgical History:  Procedure Laterality Date   CARDIOVERSION N/A 01/12/2022   Procedure: CARDIOVERSION;  Surgeon: Thurmon Fair, MD;  Location: [facility];  Service: Cardiovascular;  Laterality: N/A;   SPINE SURGERY     THUMB ARTHROSCOPY      Current Medications: Current Meds  Medication  Sig   apixaban (ELIQUIS) 5 MG TABS tablet Take 1 tablet (5 mg total) by mouth 2 (two) times daily.   doxycycline (VIBRAMYCIN) 100 MG capsule Take 100 mg by mouth 2 (two) times daily as needed (skin blemishes).   finasteride (PROPECIA) 1 MG tablet Take 0.25 mg by mouth in the morning. 1/4 tablet   Multiple Vitamin (MULTIVITAMIN WITH MINERALS) TABS tablet Take 1 tablet by mouth in the morning. One A Day for Men   ZOLMitriptan (ZOMIG) 2.5 MG tablet Take 1 tablet (2.5 mg total) by mouth once for 1 dose. May repeat in 2 hours if headache persists or recurs.     Allergies:   Patient has no known allergies.   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  Tobacco Use   Smoking status: Never   Smokeless tobacco: Never  Substance and Sexual Activity   Alcohol use: Yes   Drug use: No   Sexual activity: Yes  Other Topics Concern   Not on file  Social History Narrative   Not on file   Social Determinants of Health   Financial Resource Strain: Not on file  Food Insecurity: Not on file  Transportation Needs: Not on file  Physical Activity:  Not on file  Stress: Not on file  Social Connections: Not on file     Family History: The patient's family history includes Arrhythmia in his mother; Hyperlipidemia in his father.  ROS:   Please see the history of present illness.     All other systems reviewed and are negative.  EKGs/Labs/Other Studies Reviewed:    The following studies were reviewed today:   EKG:   01/19/22: Sinus bradycardia, PAC, rate 59 12/20/2021: A-fib, rate 96 12/01/21:afib, rate 83  Recent Labs: 11/18/2021: ALT 16; TSH 1.52 01/05/2022: BUN 13; Creatinine, Ser 0.79; Hemoglobin 13.4; Platelets 164; Potassium 4.4; Sodium 140  Recent Lipid Panel    Component Value  Date/Time   CHOL 165 11/18/2021 1445   CHOL 148 09/24/2019 1138   TRIG 59.0 11/18/2021 1445   HDL 46.90 11/18/2021 1445   HDL 45 09/24/2019 1138   CHOLHDL 4 11/18/2021 1445   VLDL 11.8 11/18/2021 1445   LDLCALC 106 (H) 11/18/2021 1445   LDLCALC 94 09/24/2019 1138    Physical Exam:    VS:  BP 128/88   Pulse (!) 59   Ht 6' 5.5" (1.969 m)   Wt 220 lb 12.8 oz (100.2 kg)   BMI 25.85 kg/m     Wt Readings from Last 3 Encounters:  01/19/22 220 lb 12.8 oz (100.2 kg)  01/12/22 215 lb (97.5 kg)  12/20/21 221 lb 9.6 oz (100.5 kg)     GEN:   in no acute distress HEENT: Normal NECK: No JVD; No carotid bruits CARDIAC: RRR, no murmurs, rubs, gallops RESPIRATORY:  Clear to auscultation without rales, wheezing or rhonchi  ABDOMEN: Soft, non-tender, non-distended MUSCULOSKELETAL:  No edema; No deformity  SKIN: Warm and dry NEUROLOGIC:  Alert and oriented x 3 PSYCHIATRIC:  Normal affect   ASSESSMENT:    1. Persistent atrial fibrillation (HCC)   2. Snoring   3. Daytime somnolence      PLAN:    Persistent atrial fibrillation: CHA2DS2-VASc score 0.  Noted to be in A-fib at PCP visit 6/29.  Echocardiogram 12/16/2021 showed normal biventricular function, no significant valvular disease.  Underwent successful cardioversion 01/12/2022 -Appears to be maintaining sinus rhythm.  Recommend Kardia mobile to evaluate for Afib recurrence. -Reports snoring/daytime somnolence, will check sleep study to evaluate for OSA -CHA2DS2-VASc score 0, will not need long-term anticoagulation but will need to be on Eliquis for 1 month post cardioversion. -Would recommend referral to EP for evaluation for ablation.  Has appoint with Dr. Elberta Fortis 02/07/2022  Snoring/daytime somnolence: Check sleep study  RTC in 6 months  Medication Adjustments/Labs and Tests Ordered: Current medicines are reviewed at length with the patient today.  Concerns regarding medicines are outlined above.  No orders of the defined  types were placed in this encounter.  No orders of the defined types were placed in this encounter.   Patient Instructions  Medication Instructions:  Take Eliquis until September 23rd, then ok to stop  *If you need a refill on your cardiac medications before your next appointment, please call your pharmacy*  Follow-Up: At [facility], you and your health needs are our priority.  As part of our continuing mission to provide you with exceptional heart care, we have created designated Provider Care Teams.  These Care Teams include your primary Cardiologist (physician) and Advanced Practice Providers (APPs -  Physician Assistants and Nurse Practitioners) who all work together to provide you with the care you need, when you need it.  We recommend signing up  for the patient portal called "MyChart".  Sign up information is provided on this After Visit Summary.  MyChart is used to connect with patients for Virtual Visits (Telemedicine).  Patients are able to view lab/test results, encounter notes, upcoming appointments, etc.  Non-urgent messages can be sent to your provider as well.   To learn more about what you can do with MyChart, go to [email or web address].    Your next appointment:   6 month(s)  The format for your next appointment:   In Person  Provider:   Dr. Bjorn Pippin  Recommend [facility] --save EKG as attachment and send via Mychart message  Important Information About Sugar         Signed, Little Ishikawa, MD  01/19/2022 11:26 AM    [facility]

## 2022-02-07 ENCOUNTER — Ambulatory Visit: Payer: No Typology Code available for payment source | Attending: Cardiology | Admitting: Cardiology

## 2022-02-07 ENCOUNTER — Encounter: Payer: Self-pay | Admitting: *Deleted

## 2022-02-07 ENCOUNTER — Encounter: Payer: Self-pay | Admitting: Cardiology

## 2022-02-07 VITALS — BP 108/66 | HR 102 | Ht 77.5 in | Wt 219.2 lb

## 2022-02-07 DIAGNOSIS — I4819 Other persistent atrial fibrillation: Secondary | ICD-10-CM

## 2022-02-07 NOTE — Progress Notes (Signed)
Electrophysiology Office Note   Date:  02/07/2022   ID:  KEVAN Hays, DOB 09-Apr-1976, MRN [id number]  PCP:  Horald Pollen, MD  Cardiologist:  Gardiner Rhyme Primary Electrophysiologist:  Jaysean Manville Meredith Leeds, MD    Chief Complaint: AF   History of Present Illness: David Hays is a 46 y.o. male who is being seen today for the evaluation of AF at the request of Donato Heinz*. Presenting today for electrophysiology evaluation.  He has a history significant for atrial fibrillation.  He was seen by his primary physician 11/18/2021 and atrial fibrillation.  He had occasional lightheadedness when bending over with palpitations.  He has not been exercising.  He feels fatigued.  He does snore.  He wore a Zio patch for 3 days that showed a 100% atrial fibrillation burden.  He is status post cardioversion 01/12/2022.  He feels that he likely has gone back into atrial fibrillation.  He has mild fatigue and shortness of breath.  He is unclear as to whether or not his fatigue and shortness of breath is due to working long hours or due to atrial fibrillation.  Today, he denies symptoms of palpitations, chest pain, orthopnea, PND, lower extremity edema, claudication, dizziness, presyncope, syncope, bleeding, or neurologic sequela. The patient is tolerating medications without difficulties.    Past Medical History:  Diagnosis Date   Headache, cluster    Kidney stone    Past Surgical History:  Procedure Laterality Date   CARDIOVERSION N/A 01/12/2022   Procedure: CARDIOVERSION;  Surgeon: Sanda Klein, MD;  Location: [facility];  Service: Cardiovascular;  Laterality: N/A;   SPINE SURGERY     THUMB ARTHROSCOPY       Current Outpatient Medications  Medication Sig Dispense Refill   apixaban (ELIQUIS) 5 MG TABS tablet Take 1 tablet (5 mg total) by mouth 2 (two) times daily. 60 tablet 3   doxycycline (VIBRAMYCIN) 100 MG capsule Take 100 mg by mouth 2 (two) times daily as  needed (skin blemishes).     finasteride (PROPECIA) 1 MG tablet Take 0.25 mg by mouth in the morning. 1/4 tablet     Multiple Vitamin (MULTIVITAMIN WITH MINERALS) TABS tablet Take 1 tablet by mouth in the morning. One A Day for Men     ZOLMitriptan (ZOMIG) 2.5 MG tablet Take 1 tablet (2.5 mg total) by mouth once for 1 dose. May repeat in 2 hours if headache persists or recurs. 10 tablet 3   No current facility-administered medications for this visit.    Allergies:   Patient has no known allergies.   Social History:  The patient  reports that he has never smoked. He has never used smokeless tobacco. He reports current alcohol use. He reports that he does not use drugs.   Family History:  The patient's family history includes Arrhythmia in his mother; Hyperlipidemia in his father.    ROS:  Please see the history of present illness.   Otherwise, review of systems is positive for none.   All other systems are reviewed and negative.    PHYSICAL EXAM: VS:  BP 108/66   Pulse (!) 102   Ht 6' 5.5" (1.969 m)   Wt 219 lb 3.2 oz (99.4 kg)   SpO2 98%   BMI 25.66 kg/m  , BMI Body mass index is 25.66 kg/m. GEN: Well nourished, well developed, in no acute distress  HEENT: normal  Neck: no JVD, carotid bruits, or masses Cardiac: irregular; no murmurs, rubs, or gallops,no edema  Respiratory:  clear to auscultation bilaterally, normal work of breathing GI: soft, nontender, nondistended, + BS MS: no deformity or atrophy  Skin: warm and dry Neuro:  Strength and sensation are intact Psych: euthymic mood, full affect  EKG:  EKG is not ordered today. Personal review of the ekg ordered 11/18/21 shows AF  Recent Labs: 11/18/2021: ALT 16; TSH 1.52 01/05/2022: BUN 13; Creatinine, Ser 0.79; Hemoglobin 13.4; Platelets 164; Potassium 4.4; Sodium 140    Lipid Panel     Component Value Date/Time   CHOL 165 11/18/2021 1445   CHOL 148 09/24/2019 1138   TRIG 59.0 11/18/2021 1445   HDL 46.90 11/18/2021  1445   HDL 45 09/24/2019 1138   CHOLHDL 4 11/18/2021 1445   VLDL 11.8 11/18/2021 1445   LDLCALC 106 (H) 11/18/2021 1445   LDLCALC 94 09/24/2019 1138     Wt Readings from Last 3 Encounters:  02/07/22 219 lb 3.2 oz (99.4 kg)  01/19/22 220 lb 12.8 oz (100.2 kg)  01/12/22 215 lb (97.5 kg)      Other studies Reviewed: Additional studies/ records that were reviewed today include: TTE 12/16/21  Review of the above records today demonstrates:   1. Left ventricular ejection fraction, by estimation, is 60 to 65%. The  left ventricle has normal function. The left ventricle has no regional  wall motion abnormalities. There is mild left ventricular hypertrophy of  the basal-septal segment. Left  ventricular diastolic parameters are indeterminate.   2. Right ventricular systolic function is normal. The right ventricular  size is normal.   3. The mitral valve is normal in structure. Mild mitral valve  regurgitation. No evidence of mitral stenosis.   4. The aortic valve is normal in structure. Aortic valve regurgitation is  not visualized. No aortic stenosis is present.   5. The inferior vena cava is normal in size with greater than 50%  respiratory variability, suggesting right atrial pressure of 3 mmHg.   Cardiac monitor 01/19/2022 personally reviewed Atrial Fibrillation occurred continuously (100% burden), ranging from 58-197 bpm (avg of 109 bpm). Isolated VEs were rare (<1.0%), VE Couplets were rare (<1.0%), and no VE Triplets were present.  8 patient triggered events, corresponding to atrial fibrillation  ASSESSMENT AND PLAN:  1.  Persistent atrial fibrillation: CHA2DS2-VASc of 0.  Currently on Eliquis 5 mg twice daily due to recent cardioversion.  On auscultation, sounds that he is back in atrial fibrillation.  His heart rates are also elevated today.  Do think he would likely benefit from ablation.  We also discussed rhythm management with flecainide.  We Durrel Mcnee give him information about  ablation.  He Porsche Noguchi call us back and let us know if he wishes to schedule.  2.  Snoring/daytime somnolence: Sleep study pending  Case discussed with primary cardiology  Current medicines are reviewed at length with the patient today.   The patient does not have concerns regarding his medicines.  The following changes were made today:  none  Labs/ tests ordered today include:  No orders of the defined types were placed in this encounter.    Disposition:   FU with Quorra Rosene pending ablation decision   Signed, Oma Marzan Meredith Leeds, MD  02/07/2022 9:53 AM     [facility] [address] [place] [address] [phone] (office) [phone] (fax)

## 2022-02-07 NOTE — Patient Instructions (Addendum)
Medication Instructions:  Your physician recommends that you continue on your current medications as directed. Please refer to the Current Medication list given to you today.  *If you need a refill on your cardiac medications before your next appointment, please call your pharmacy*   Lab Work: Pre procedure labs -- see procedure instruction letter:  BMP & CBC  If you have labs (blood work) drawn today and your tests are completely normal, you will receive your results only by: MyChart Message (if you have MyChart) OR A paper copy in the mail If you have any lab test that is abnormal or we need to change your treatment, we will call you to review the results.   Testing/Procedures: Your physician has requested that you have cardiac CT within 7 days PRIOR to your ablation. Cardiac computed tomography (CT) is a painless test that uses an x-ray machine to take clear, detailed pictures of your heart.  Please follow instruction below located under "other instructions". You will get a call from our office to schedule the date for this test.  Your physician has recommended that you have an ablation. Catheter ablation is a medical procedure used to treat some cardiac arrhythmias (irregular heartbeats). During catheter ablation, a long, thin, flexible tube is put into a blood vessel in your groin (upper thigh), or neck. This tube is called an ablation catheter. It is then guided to your heart through the blood vessel. Radio frequency waves destroy small areas of heart tissue where abnormal heartbeats may cause an arrhythmia to start.   Please call the office if you would like to schedule this procedure.   Follow-Up: At [facility], you and your health needs are our priority.  As part of our continuing mission to provide you with exceptional heart care, we have created designated Provider Care Teams.  These Care Teams include your primary Cardiologist (physician) and Advanced Practice Providers (APPs  -  Physician Assistants and Nurse Practitioners) who all work together to provide you with the care you need, when you need it.  Your next appointment:   1 month(s) after your ablation  The format for your next appointment:   In Person  Provider:   [facility] clinic   Thank you for choosing [facility]!!   Dory Horn, RN [phone]    Other Instructions   Cardiac Ablation Cardiac ablation is a procedure to destroy (ablate) some heart tissue that is sending bad signals. These bad signals cause problems in heart rhythm. The heart has many areas that make these signals. If there are problems in these areas, they can make the heart beat in a way that is not normal. Destroying some tissues can help make the heart rhythm normal. Tell your doctor about: Any allergies you have. All medicines you are taking. These include vitamins, herbs, eye drops, creams, and over-the-counter medicines. Any problems you or family members have had with medicines that make you fall asleep (anesthetics). Any blood disorders you have. Any surgeries you have had. Any medical conditions you have, such as kidney failure. Whether you are pregnant or may be pregnant. What are the risks? This is a safe procedure. But problems may occur, including: Infection. Bruising and bleeding. Bleeding into the chest. Stroke or blood clots. Damage to nearby areas of your body. Allergies to medicines or dyes. The need for a pacemaker if the normal system is damaged. Failure of the procedure to treat the problem. What happens before the procedure? Medicines Ask your doctor about: Changing  or stopping your normal medicines. This is important. Taking aspirin and ibuprofen. Do not take these medicines unless your doctor tells you to take them. Taking other medicines, vitamins, herbs, and supplements. General instructions Follow instructions from your doctor about what you cannot eat or drink. Plan to have someone  take you home from the hospital or clinic. If you will be going home right after the procedure, plan to have someone with you for 24 hours. Ask your doctor what steps will be taken to prevent infection. What happens during the procedure?  An IV tube will be put into one of your veins. You will be given a medicine to help you relax. The skin on your neck or groin will be numbed. A cut (incision) will be made in your neck or groin. A needle will be put through your cut and into a large vein. A tube (catheter) will be put into the needle. The tube will be moved to your heart. Dye may be put through the tube. This helps your doctor see your heart. Small devices (electrodes) on the tube will send out signals. A type of energy will be used to destroy some heart tissue. The tube will be taken out. Pressure will be held on your cut. This helps stop bleeding. A bandage will be put over your cut. The exact procedure may vary among doctors and hospitals. What happens after the procedure? You will be watched until you leave the hospital or clinic. This includes checking your heart rate, breathing rate, oxygen, and blood pressure. Your cut will be watched for bleeding. You will need to lie still for a few hours. Do not drive for 24 hours or as long as your doctor tells you. Summary Cardiac ablation is a procedure to destroy some heart tissue. This is done to treat heart rhythm problems. Tell your doctor about any medical conditions you may have. Tell him or her about all medicines you are taking to treat them. This is a safe procedure. But problems may occur. These include infection, bruising, bleeding, and damage to nearby areas of your body. Follow what your doctor tells you about food and drink. You may also be told to change or stop some of your medicines. After the procedure, do not drive for 24 hours or as long as your doctor tells you. This information is not intended to replace advice given to  you by your health care provider. Make sure you discuss any questions you have with your health care provider. Document Revised: 07/30/2021 Document Reviewed: 04/11/2019 Elsevier Patient Education  [place].      Flecainide Tablets What is this medication? FLECAINIDE (FLEK a nide) prevents and treats a fast or irregular heartbeat (arrhythmia). It is often used to treat a type of arrhythmia known as AFib (atrial fibrillation). It works by slowing down overactive electric signals in the heart, which stabilizes your heart rhythm. It belongs to a group of medications called antiarrhythmics. This medicine may be used for other purposes; ask your health care provider or pharmacist if you have questions. COMMON BRAND NAME(S): Tambocor What should I tell my care team before I take this medication? They need to know if you have any of these conditions: Abnormal levels of potassium in the blood Heart disease including heart rhythm and heart rate problems Kidney or liver disease Recent heart attack An unusual or allergic reaction to flecainide, local anesthetics, other medications, foods, dyes, or preservatives Pregnant or trying to get pregnant Breast-feeding How should  I use this medication? Take this medication by mouth with a glass of water. Follow the directions on the prescription label. You can take this medication with or without food. Take your doses at regular intervals. Do not take your medication more often than directed. Do not stop taking this medication suddenly. This may cause serious, heart-related side effects. If your care team wants you to stop the medication, the dose may be slowly lowered over time to avoid any side effects. Talk to your care team regarding the use of this medication in children. While this medication may be prescribed for children as young as 1 year of age for selected conditions, precautions do apply. Overdosage: If you think you have taken too much of  this medicine contact a poison control center or emergency room at once. NOTE: This medicine is only for you. Do not share this medicine with others. What if I miss a dose? If you miss a dose, take it as soon as you can. If it is almost time for your next dose, take only that dose. Do not take double or extra doses. What may interact with this medication? Do not take this medication with any of the following: Amoxapine Arsenic trioxide Certain antibiotics like clarithromycin, erythromycin, gatifloxacin, gemifloxacin, levofloxacin, moxifloxacin, sparfloxacin, or troleandomycin Certain antidepressants called tricyclic antidepressants like amitriptyline, imipramine, or nortriptyline Certain medications to control heart rhythm like disopyramide, encainide, moricizine, procainamide, propafenone, and quinidine Cisapride Delavirdine Droperidol Haloperidol [place] Imatinib Levomethadyl Maprotiline Medications for malaria like chloroquine and halofantrine Pentamidine Phenothiazines like chlorpromazine, mesoridazine, prochlorperazine, thioridazine Pimozide Quinine Ranolazine Ritonavir Sertindole This medication may also interact with the following: Cimetidine Dofetilide Medications for angina or high blood pressure Medications to control heart rhythm like amiodarone and digoxin Ziprasidone This list may not describe all possible interactions. Give your health care provider a list of all the medicines, herbs, non-prescription drugs, or dietary supplements you use. Also tell them if you smoke, drink alcohol, or use illegal drugs. Some items may interact with your medicine. What should I watch for while using this medication? Visit your care team for regular checks on your progress. Because your condition and the use of this medication carries some risk, it is a good idea to carry an identification card, necklace or bracelet with details of your condition, medications, and care team. Check  your blood pressure and pulse rate regularly. Ask your care team what your blood pressure and pulse rate should be, and when you should contact them. Your care team also may schedule regular blood tests and electrocardiograms to check your progress. You may get drowsy or dizzy. Do not drive, use machinery, or do anything that needs mental alertness until you know how this medication affects you. Do not stand or sit up quickly, especially if you are an older patient. This reduces the risk of dizzy or fainting spells. Alcohol can make you more dizzy, increase flushing and rapid heartbeats. Avoid alcoholic drinks. What side effects may I notice from receiving this medication? Side effects that you should report to your care team as soon as possible: Allergic reactions--skin rash, itching, hives, swelling of the face, lips, tongue, or throat Heart failure--shortness of breath, swelling of the ankles, feet, or hands, sudden weight gain, unusual weakness or fatigue Heart rhythm changes--fast or irregular heartbeat, dizziness, feeling faint or lightheaded, chest pain, trouble breathing Liver injury--right upper belly pain, loss of appetite, nausea, light-colored stool, dark yellow or brown urine, yellowing skin or eyes, unusual weakness or fatigue Side  effects that usually do not require medical attention (report to your care team if they continue or are bothersome): Blurry vision Constipation Dizziness Fatigue Headache Nausea Tremors or shaking This list may not describe all possible side effects. Call your doctor for medical advice about side effects. You may report side effects to FDA at 1-800-FDA-1088. Where should I keep my medication? Keep out of the reach of children and pets. Store at room temperature between 15 and 30 degrees C (59 and 86 degrees F). Protect from light. Keep container tightly closed. Throw away any unused medication after the expiration date. NOTE: This sheet is a summary. It  may not cover all possible information. If you have questions about this medicine, talk to your doctor, pharmacist, or health care provider.  2023 Elsevier/Gold Standard (2007-06-30 00:00:00)

## 2022-02-11 ENCOUNTER — Ambulatory Visit (HOSPITAL_BASED_OUTPATIENT_CLINIC_OR_DEPARTMENT_OTHER): Payer: No Typology Code available for payment source | Attending: Cardiology | Admitting: Cardiovascular Disease

## 2022-02-11 DIAGNOSIS — R0683 Snoring: Secondary | ICD-10-CM

## 2022-02-11 DIAGNOSIS — G4730 Sleep apnea, unspecified: Secondary | ICD-10-CM | POA: Diagnosis not present

## 2022-02-11 DIAGNOSIS — Z79899 Other long term (current) drug therapy: Secondary | ICD-10-CM | POA: Diagnosis not present

## 2022-02-11 DIAGNOSIS — I4819 Other persistent atrial fibrillation: Secondary | ICD-10-CM | POA: Insufficient documentation

## 2022-02-11 DIAGNOSIS — G4736 Sleep related hypoventilation in conditions classified elsewhere: Secondary | ICD-10-CM | POA: Diagnosis not present

## 2022-02-11 DIAGNOSIS — R400 Somnolence: Secondary | ICD-10-CM | POA: Insufficient documentation

## 2022-02-11 DIAGNOSIS — Z7901 Long term (current) use of anticoagulants: Secondary | ICD-10-CM | POA: Diagnosis not present

## 2022-02-23 NOTE — Procedures (Signed)
Patient Name: David Hays, David Hays Date: 02/11/2022 Gender: Male D.O.B: May 17, 1976 Age (years): 45 Referring Provider: Epifanio Lesches Height (inches): 76 Interpreting Physician: Nicki Guadalajara MD, ABSM Weight (lbs): 215 RPSGT: Lowry Ram BMI: 26 MRN: [id number] Neck Size: 15.50  CLINICAL INFORMATION Sleep Study Type: NPSG  Indication for sleep study: snoring, excessive daytime sleepiness, persistent AF  Epworth Sleepiness Score: 20  SLEEP STUDY TECHNIQUE As per the AASM Manual for the Scoring of Sleep and Associated Events v2.3 (April 2016) with a hypopnea requiring 4% desaturations.  The channels recorded and monitored were frontal, central and occipital EEG, electrooculogram (EOG), submentalis EMG (chin), nasal and oral airflow, thoracic and abdominal wall motion, anterior tibialis EMG, snore microphone, electrocardiogram, and pulse oximetry.  MEDICATIONS apixaban (ELIQUIS) 5 MG TABS tablet doxycycline (VIBRAMYCIN) 100 MG capsule finasteride (PROPECIA) 1 MG tablet Multiple Vitamin (MULTIVITAMIN WITH MINERALS) TABS tablet ZOLMitriptan (ZOMIG) 2.5 MG tablet Medications self-administered by patient taken the night of the study : N/A  SLEEP ARCHITECTURE The study was initiated at 10:45:59 PM and ended at 4:48:45 AM.  Sleep onset time was 24.5 minutes and the sleep efficiency was 88.5%%. The total sleep time was 321 minutes.  Stage REM latency was 121.0 minutes.  The patient spent 2.6%% of the night in stage N1 sleep, 79.0%% in stage N2 sleep, 0.0%% in stage N3 and 18.4% in REM.  Alpha intrusion was absent.  Supine sleep was 29.91%.  RESPIRATORY PARAMETERS The overall apnea/hypopnea index (AHI) was 3.9 per hour. The respiratory disturbance index (RDI) was 4.5/h. There were 2 total apneas, including 1 obstructive, 1 central and 0 mixed apneas. There were 19 hypopneas and 3 RERAs.  The AHI during Stage REM sleep was 8.1 per hour.  AHI while supine  was 6.3 per hour.  The mean oxygen saturation was 94.0%. The minimum SpO2 during sleep was 87.0%.  Soft snoring was noted during this study.  CARDIAC DATA The 2 lead EKG demonstrated atrial fibrillation. The mean heart rate was 81.0 beats per minute. Other EKG findings include: None.  LEG MOVEMENT DATA The total PLMS were 0 with a resulting PLMS index of 0.0. Associated arousal with leg movement index was 0.6 .  IMPRESSIONS - Increased upper airway resistance without definitive obstructive sleep apnea overall (AHI 3.9/h; RDI 4.5/h); however, sleep apnea was mild with supine sleep (AHI 6.3/h) and during REM sleep (AHI 8.1/h).  - No significant central sleep apnea occurred during this study (CAI 0.2/h). - Mild oxygen desaturation to a nadir of 87.0%. - The patient snored with soft snoring volume. - Cardiac abnormalities were noted during this study: atrial fibrillation. - Clinically significant periodic limb movements did not occur during sleep. No significant associated arousals.  DIAGNOSIS - Sleep apnea, unspecified (G47.30) - UARS - Nocturnal Hypoxemia (G47.36)  RECOMMENDATIONS - At present patient does not meet criteria for CPAP therapy. - Effort should be made to optimize nasal and oropharyngeal patency.  - If patient continues to have significant daytime sleepiness with adequate sleep duration consider a follow-up PSG/multiple lattency sleep test (MLST) to assess for idiopathic hypersomnolence or narcolepsy.  - Avoid alcohol, sedatives and other CNS depressants that may worsen sleep apnea and disrupt normal sleep architecture. - Sleep hygiene should be reviewed to assess factors that may improve sleep quality. - Weight management and regular exercise should be initiated or continued if appropriate.  [Electronically signed] 02/23/2022 03:52 PM  Nicki Guadalajara MD, [facility], ABSM Diplomate, American Board of Sleep Medicine  NPI: [id number]  [facility] PH:  [phone]   FX: [phone] [place]

## 2022-03-02 ENCOUNTER — Telehealth: Payer: Self-pay | Admitting: *Deleted

## 2022-03-02 NOTE — Telephone Encounter (Signed)
-----   Message from Troy Sine, MD sent at 02/23/2022  3:58 PM EDT ----- Mariann Laster, please notify patient of results.  With significant excessive daytime sleepiness, depending upon sleep duration, may need consideration for future PSG/mL ST to assess for idiopathic hypersomnolence or narcolepsy.

## 2022-03-02 NOTE — Telephone Encounter (Signed)
Patient notified of sleep study results and recommendations. He voiced understanding and has no questions.

## 2022-05-26 ENCOUNTER — Ambulatory Visit: Payer: No Typology Code available for payment source | Admitting: Emergency Medicine

## 2022-06-16 ENCOUNTER — Ambulatory Visit (INDEPENDENT_AMBULATORY_CARE_PROVIDER_SITE_OTHER): Payer: No Typology Code available for payment source | Admitting: Emergency Medicine

## 2022-06-16 ENCOUNTER — Encounter: Payer: Self-pay | Admitting: Emergency Medicine

## 2022-06-16 VITALS — BP 110/74 | HR 75 | Temp 97.9°F | Ht 76.0 in | Wt 224.0 lb

## 2022-06-16 DIAGNOSIS — I4819 Other persistent atrial fibrillation: Secondary | ICD-10-CM

## 2022-06-16 NOTE — Progress Notes (Signed)
David Hays 47 y.o.   Chief Complaint  Patient presents with   Follow-up    68mnth f/u appt, no concerns     HISTORY OF PRESENT ILLNESS: This is a 47 y.o. male with history of persistent atrial fibrillation, presently on no medications, here for 38-month follow-up. Has been seen in the past by Dr. Allegra Lai and ablation was discussed. Last office visit with cardiologist as follows:  ASSESSMENT AND PLAN:   1.  Persistent atrial fibrillation: CHA2DS2-VASc of 0.  Currently on Eliquis 5 mg twice daily due to recent cardioversion.  On auscultation, sounds that he is back in atrial fibrillation.  His heart rates are also elevated today.  Do think he would likely benefit from ablation.  We also discussed rhythm management with flecainide.  We will give him information about ablation.  He will call us back and let us know if he wishes to schedule.   2.  Snoring/daytime somnolence: Sleep study pending   Case discussed with primary cardiology   Current medicines are reviewed at length with the patient today.   The patient does not have concerns regarding his medicines.  The following changes were made today:  none   Labs/ tests ordered today include:  No orders of the defined types were placed in this encounter.      Disposition:   FU with Will Camnitz pending ablation decision    Signed, Will Meredith Leeds, MD  02/07/2022 9:53 AM    HPI   Prior to Admission medications   Medication Sig Start Date End Date Taking? Authorizing Provider  apixaban (ELIQUIS) 5 MG TABS tablet Take 1 tablet (5 mg total) by mouth 2 (two) times daily. 12/01/21  Yes Donato Heinz, MD  doxycycline (VIBRAMYCIN) 100 MG capsule Take 100 mg by mouth 2 (two) times daily as needed (skin blemishes).   Yes [provider]  finasteride (PROPECIA) 1 MG tablet Take 0.25 mg by mouth in the morning. 1/4 tablet 10/19/21  Yes [provider]  Multiple Vitamin (MULTIVITAMIN WITH MINERALS) TABS  tablet Take 1 tablet by mouth in the morning. One A Day for Men   Yes [provider]  ZOLMitriptan (ZOMIG) 2.5 MG tablet Take 1 tablet (2.5 mg total) by mouth once for 1 dose. May repeat in 2 hours if headache persists or recurs. 09/24/19 01/12/24 Yes SagardiaInes Bloomer, MD    No Known Allergies  Patient Active Problem List   Diagnosis Date Noted   Nonspecific abnormal electrocardiogram (ECG) (EKG) 01/19/2022   Persistent atrial fibrillation ([name]) 10/13/2016    Past Medical History:  Diagnosis Date   Headache, cluster    Kidney stone     Past Surgical History:  Procedure Laterality Date   CARDIOVERSION N/A 01/12/2022   Procedure: CARDIOVERSION;  Surgeon: Sanda Klein, MD;  Location: [facility];  Service: Cardiovascular;  Laterality: N/A;   SPINE SURGERY     THUMB ARTHROSCOPY      Social History   Socioeconomic History   Marital status: Married    Spouse name: Not on file   Number of children: 0   Years of education: Not on file   Highest education level: Not on file  Occupational History   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  Tobacco Use   Smoking status: Never   Smokeless tobacco: Never  Substance and Sexual Activity   Alcohol use: Yes   Drug use: No   Sexual activity: Yes  Other Topics Concern   Not on file  Social History Narrative   Not on file   Social Determinants of Health   Financial Resource Strain: Not on file  Food Insecurity: Not on file  Transportation Needs: Not on file  Physical Activity: Not on file  Stress: Not on file  Social Connections: Not on file  Intimate Partner Violence: Not on file    Family History  Problem Relation Age of Onset   Arrhythmia Mother    Hyperlipidemia Father      Review of Systems  Constitutional: Negative.  Negative for chills and fever.  HENT: Negative.  Negative for congestion and sore throat.   Respiratory: Negative.  Negative for cough and shortness of breath.   Cardiovascular:  Positive for  palpitations. Negative for chest pain.  Gastrointestinal:  Negative for abdominal pain, diarrhea, nausea and vomiting.  Genitourinary: Negative.  Negative for dysuria and hematuria.  Skin: Negative.  Negative for rash.  Neurological: Negative.  Negative for dizziness and headaches.  All other systems reviewed and are negative.   Today's Vitals   06/16/22 1430  BP: 110/74  Pulse: 75  Temp: 97.9 F (36.6 C)  TempSrc: Oral  SpO2: 98%  Weight: 224 lb (101.6 kg)  Height: 6\' 4"  (1.93 m)   Body mass index is 27.27 kg/m.  Physical Exam Vitals reviewed.  Constitutional:      Appearance: Normal appearance.  HENT:     Head: Normocephalic.  Eyes:     Extraocular Movements: Extraocular movements intact.  Cardiovascular:     Rate and Rhythm: Normal rate. Rhythm irregular.     Pulses: Normal pulses.     Heart sounds: Normal heart sounds.  Pulmonary:     Effort: Pulmonary effort is normal.     Breath sounds: Normal breath sounds.  Musculoskeletal:     Cervical back: No tenderness.  Lymphadenopathy:     Cervical: No cervical adenopathy.  Skin:    General: Skin is warm and dry.     Capillary Refill: Capillary refill takes less than 2 seconds.  Neurological:     General: No focal deficit present.     Mental Status: He is alert and oriented to person, place, and time.  Psychiatric:        Mood and Affect: Mood normal.        Behavior: Behavior normal.      ASSESSMENT & PLAN: A total of 35 minutes was spent with the patient and counseling/coordination of care regarding preparing for this visit, review of most recent office visit notes, review of most recent cardiologist office visit notes, review of most recent sleep studies report, review of all medications, diagnosis of persistent atrial fibrillation and treatment options, cardiovascular risks associated with atrial fibrillation, prognosis, documentation, and need for follow-up with cardiologist.  Problem List Items Addressed  This Visit       Cardiovascular and Mediastinum   Persistent atrial fibrillation (HCC) - Primary    Presently in atrial fibrillation with a controlled ventricular rate. No anticoagulation at present time. No rate or rhythm control medication at present time. Asymptomatic.  Clinically stable. Recent sleep studies showed no criteria for CPAP treatment. Patient is unsure about ablation treatment. Cardiovascular risks associated with atrial fibrillation discussed. I think he is a good candidate for ablation.  Strongly recommend he gets it.  Needs to follow-up with his cardiologist to discuss further treatments.      Patient Instructions  Needs to follow-up with Dr. regarding cardiac ablation for persistent atrial fibrillation.  Atrial Fibrillation  Atrial fibrillation is a type of heartbeat that is irregular or fast. If you have this condition, your heart beats without any order. This makes it hard for your heart to pump blood in a normal way. Atrial fibrillation may come and go, or it may become a long-lasting problem. If this condition is not treated, it can put you at higher risk for stroke, heart failure, and other heart problems. What are the causes? This condition may be caused by diseases that damage the heart. They include: High blood pressure. Heart failure. Heart valve disease. Heart surgery. Other causes include: Diabetes. Thyroid disease. Being overweight. Kidney disease. Sometimes the cause is not known. What increases the risk? You are more likely to develop this condition if: You are older. You smoke. You exercise often and very hard. You have a family history of this condition. You are a man. You use drugs. You drink a lot of alcohol. You have lung conditions, such as emphysema, pneumonia, or COPD. You have sleep apnea. What are the signs or symptoms? Common symptoms of this condition include: A feeling that your heart is beating very  fast. Chest pain or discomfort. Feeling short of breath. Suddenly feeling light-headed or weak. Getting tired easily during activity. Fainting. Sweating. In some cases, there are no symptoms. How is this treated? Treatment for this condition depends on underlying conditions and how you feel when you have atrial fibrillation. They include: Medicines to: Prevent blood clots. Treat heart rate or heart rhythm problems. Using devices, such as a pacemaker, to correct heart rhythm problems. Doing surgery to remove the part of the heart that sends bad signals. Closing an area where clots can form in the heart (left atrial appendage). In some cases, your doctor will treat other underlying conditions. Follow these instructions at home: Medicines Take over-the-counter and prescription medicines only as told by your doctor. Do not take any new medicines without first talking to your doctor. If you are taking blood thinners: Talk with your doctor before you take any medicines that have aspirin or NSAIDs, such as ibuprofen, in them. Take your medicine exactly as told by your doctor. Take it at the same time each day. Avoid activities that could hurt or bruise you. Follow instructions about how to prevent falls. Wear a bracelet that says you are taking blood thinners. Or, carry a card that lists what medicines you take. Lifestyle     Do not use any products that have nicotine or tobacco in them. These include cigarettes, e-cigarettes, and chewing tobacco. If you need help quitting, ask your doctor. Eat heart-healthy foods. Talk with your doctor about the right eating plan for you. Exercise regularly as told by your doctor. Do not drink alcohol. Lose weight if you are overweight. Do not use drugs, including cannabis. General instructions If you have a condition that causes breathing to stop for a short period of time (apnea), treat it as told by your doctor. Keep a healthy weight. Do not use  diet pills unless your doctor says they are safe for you. Diet pills may make heart problems worse. Keep all follow-up visits as told by your doctor. This is important. Contact a doctor if: You notice a change in the speed, rhythm, or strength of your heartbeat. You are taking a blood-thinning medicine and you get more bruising. You get tired more easily when you move or exercise. You have a sudden change in weight. Get help right away if:  You have pain in your chest  or your belly (abdomen). You have trouble breathing. You have side effects of blood thinners, such as blood in your vomit, poop (stool), or pee (urine), or bleeding that cannot stop. You have any signs of a stroke. "BE FAST" is an easy way to remember the main warning signs: B - Balance. Signs are dizziness, sudden trouble walking, or loss of balance. E - Eyes. Signs are trouble seeing or a change in how you see. F - Face. Signs are sudden weakness or loss of feeling in the face, or the face or eyelid drooping on one side. A - Arms. Signs are weakness or loss of feeling in an arm. This happens suddenly and usually on one side of the body. S - Speech. Signs are sudden trouble speaking, slurred speech, or trouble understanding what people say. T - Time. Time to call emergency services. Write down what time symptoms started. You have other signs of a stroke, such as: A sudden, very bad headache with no known cause. Feeling like you may vomit (nausea). Vomiting. A seizure. These symptoms may be an emergency. Do not wait to see if the symptoms will go away. Get medical help right away. Call your local emergency services (911 in the [place] Do not drive yourself to the hospital. Summary Atrial fibrillation is a type of heartbeat that is irregular or fast. You are at higher risk of this condition if you smoke, are older, have diabetes, or are overweight. Follow your doctor's instructions about medicines, diet, exercise, and  follow-up visits. Get help right away if you have signs or symptoms of a stroke. Get help right away if you cannot catch your breath, or you have chest pain or discomfort. This information is not intended to replace advice given to you by your health care provider. Make sure you discuss any questions you have with your health care provider. Document Revised: 10/31/2018 Document Reviewed: 10/31/2018 Elsevier Patient Education  [place], MD [facility]

## 2022-06-16 NOTE — Patient Instructions (Signed)
Needs to follow-up with Dr. Allegra Lai regarding cardiac ablation for persistent atrial fibrillation.  Atrial Fibrillation  Atrial fibrillation is a type of heartbeat that is irregular or fast. If you have this condition, your heart beats without any order. This makes it hard for your heart to pump blood in a normal way. Atrial fibrillation may come and go, or it may become a long-lasting problem. If this condition is not treated, it can put you at higher risk for stroke, heart failure, and other heart problems. What are the causes? This condition may be caused by diseases that damage the heart. They include: High blood pressure. Heart failure. Heart valve disease. Heart surgery. Other causes include: Diabetes. Thyroid disease. Being overweight. Kidney disease. Sometimes the cause is not known. What increases the risk? You are more likely to develop this condition if: You are older. You smoke. You exercise often and very hard. You have a family history of this condition. You are a man. You use drugs. You drink a lot of alcohol. You have lung conditions, such as emphysema, pneumonia, or COPD. You have sleep apnea. What are the signs or symptoms? Common symptoms of this condition include: A feeling that your heart is beating very fast. Chest pain or discomfort. Feeling short of breath. Suddenly feeling light-headed or weak. Getting tired easily during activity. Fainting. Sweating. In some cases, there are no symptoms. How is this treated? Treatment for this condition depends on underlying conditions and how you feel when you have atrial fibrillation. They include: Medicines to: Prevent blood clots. Treat heart rate or heart rhythm problems. Using devices, such as a pacemaker, to correct heart rhythm problems. Doing surgery to remove the part of the heart that sends bad signals. Closing an area where clots can form in the heart (left atrial appendage). In some cases,  your doctor will treat other underlying conditions. Follow these instructions at home: Medicines Take over-the-counter and prescription medicines only as told by your doctor. Do not take any new medicines without first talking to your doctor. If you are taking blood thinners: Talk with your doctor before you take any medicines that have aspirin or NSAIDs, such as ibuprofen, in them. Take your medicine exactly as told by your doctor. Take it at the same time each day. Avoid activities that could hurt or bruise you. Follow instructions about how to prevent falls. Wear a bracelet that says you are taking blood thinners. Or, carry a card that lists what medicines you take. Lifestyle     Do not use any products that have nicotine or tobacco in them. These include cigarettes, e-cigarettes, and chewing tobacco. If you need help quitting, ask your doctor. Eat heart-healthy foods. Talk with your doctor about the right eating plan for you. Exercise regularly as told by your doctor. Do not drink alcohol. Lose weight if you are overweight. Do not use drugs, including cannabis. General instructions If you have a condition that causes breathing to stop for a short period of time (apnea), treat it as told by your doctor. Keep a healthy weight. Do not use diet pills unless your doctor says they are safe for you. Diet pills may make heart problems worse. Keep all follow-up visits as told by your doctor. This is important. Contact a doctor if: You notice a change in the speed, rhythm, or strength of your heartbeat. You are taking a blood-thinning medicine and you get more bruising. You get tired more easily when you move or exercise. You have  a sudden change in weight. Get help right away if:  You have pain in your chest or your belly (abdomen). You have trouble breathing. You have side effects of blood thinners, such as blood in your vomit, poop (stool), or pee (urine), or bleeding that cannot  stop. You have any signs of a stroke. "BE FAST" is an easy way to remember the main warning signs: B - Balance. Signs are dizziness, sudden trouble walking, or loss of balance. E - Eyes. Signs are trouble seeing or a change in how you see. F - Face. Signs are sudden weakness or loss of feeling in the face, or the face or eyelid drooping on one side. A - Arms. Signs are weakness or loss of feeling in an arm. This happens suddenly and usually on one side of the body. S - Speech. Signs are sudden trouble speaking, slurred speech, or trouble understanding what people say. T - Time. Time to call emergency services. Write down what time symptoms started. You have other signs of a stroke, such as: A sudden, very bad headache with no known cause. Feeling like you may vomit (nausea). Vomiting. A seizure. These symptoms may be an emergency. Do not wait to see if the symptoms will go away. Get medical help right away. Call your local emergency services (911 in the [place] Do not drive yourself to the hospital. Summary Atrial fibrillation is a type of heartbeat that is irregular or fast. You are at higher risk of this condition if you smoke, are older, have diabetes, or are overweight. Follow your doctor's instructions about medicines, diet, exercise, and follow-up visits. Get help right away if you have signs or symptoms of a stroke. Get help right away if you cannot catch your breath, or you have chest pain or discomfort. This information is not intended to replace advice given to you by your health care provider. Make sure you discuss any questions you have with your health care provider. Document Revised: 10/31/2018 Document Reviewed: 10/31/2018 Elsevier Patient Education  [place].

## 2022-06-16 NOTE — Assessment & Plan Note (Signed)
Presently in atrial fibrillation with a controlled ventricular rate. No anticoagulation at present time. No rate or rhythm control medication at present time. Asymptomatic.  Clinically stable. Recent sleep studies showed no criteria for CPAP treatment. Patient is unsure about ablation treatment. Cardiovascular risks associated with atrial fibrillation discussed. I think he is a good candidate for ablation.  Strongly recommend he gets it.  Needs to follow-up with his cardiologist to discuss further treatments.

## 2022-10-05 ENCOUNTER — Telehealth: Payer: Self-pay

## 2022-10-05 NOTE — Transitions of Care (Post Inpatient/ED Visit) (Signed)
   10/05/2022  Name: David Hays MRN: [id number] DOB: 03/10/1976  Today's TOC FU Call Status: Today's TOC FU Call Status:: Successful TOC FU Call Competed  Transition Care Management Follow-up Telephone Call Date of Discharge: 10/04/22 Discharge Facility: Other ([facility]) Name of Other ([facility]) [facility] Facility: [facility] Type of Discharge: Inpatient Admission Primary Inpatient Discharge Diagnosis:: left ureteral stone , appendicitis How have you been since you were released from the hospital?: Better Any questions or concerns?: No  Items Reviewed: Did you receive and understand the discharge instructions provided?: Yes Medications obtained,verified, and reconciled?: Yes (Medications Reviewed) Any new allergies since your discharge?: No Dietary orders reviewed?: NA Do you have support at home?: Yes People in Home: spouse  Medications Reviewed Today: Medications Reviewed Today     Reviewed by Georgina Quint, MD (Physician) on 06/16/22 at 1623  Med List Status: <None>   Medication Order Taking? Sig Documenting Provider Last Dose Status Informant  finasteride (PROPECIA) 1 MG tablet [id number] Yes Take 0.25 mg by mouth in the morning. 1/4 tablet [provider] Taking Active Self  Multiple Vitamin (MULTIVITAMIN WITH MINERALS) TABS tablet [id number] Yes Take 1 tablet by mouth in the morning. One A Day for Men [provider] Taking Active Self  ZOLMitriptan (ZOMIG) 2.5 MG tablet [id number] Yes Take 1 tablet (2.5 mg total) by mouth once for 1 dose. May repeat in 2 hours if headache persists or recurs. Georgina Quint, MD Taking Active Self           Med Note Tiburcio Pea, Marice Potter Jan 11, 2022  8:49 AM) On hand (more than a year since last dose)            Home Care and Equipment/Supplies: Were Home Health Services Ordered?: No Any new equipment or medical supplies ordered?: No  Functional Questionnaire: Do you need assistance  with bathing/showering or dressing?: No Do you need assistance with meal preparation?: No Do you need assistance with eating?: No Do you have difficulty maintaining continence: No Do you need assistance with getting out of bed/getting out of a chair/moving?: No Do you have difficulty managing or taking your medications?: No  Follow up appointments reviewed: PCP Follow-up appointment confirmed?: NA Specialist Hospital Follow-up appointment confirmed?: Yes Date of Specialist follow-up appointment?: 10/07/22 Follow-Up Specialty Provider:: Dr Berna Bue Do you need transportation to your follow-up appointment?: No Do you understand care options if your condition(s) worsen?: Yes-patient verbalized understanding    SIGNATURE David Hays,CMA [facility] [place],[facility]

## 2023-04-17 ENCOUNTER — Ambulatory Visit (INDEPENDENT_AMBULATORY_CARE_PROVIDER_SITE_OTHER): Payer: No Typology Code available for payment source | Admitting: Emergency Medicine

## 2023-04-17 ENCOUNTER — Encounter: Payer: Self-pay | Admitting: Emergency Medicine

## 2023-04-17 VITALS — BP 100/68 | HR 79 | Temp 97.6°F | Ht 76.0 in | Wt 224.0 lb

## 2023-04-17 DIAGNOSIS — Z130 Encounter for screening for diseases of the blood and blood-forming organs and certain disorders involving the immune mechanism: Secondary | ICD-10-CM | POA: Diagnosis not present

## 2023-04-17 DIAGNOSIS — Z0000 Encounter for general adult medical examination without abnormal findings: Secondary | ICD-10-CM

## 2023-04-17 DIAGNOSIS — Z0001 Encounter for general adult medical examination with abnormal findings: Secondary | ICD-10-CM

## 2023-04-17 DIAGNOSIS — Z13220 Encounter for screening for lipoid disorders: Secondary | ICD-10-CM | POA: Diagnosis not present

## 2023-04-17 DIAGNOSIS — Z1329 Encounter for screening for other suspected endocrine disorder: Secondary | ICD-10-CM | POA: Diagnosis not present

## 2023-04-17 DIAGNOSIS — I4819 Other persistent atrial fibrillation: Secondary | ICD-10-CM

## 2023-04-17 DIAGNOSIS — Z13228 Encounter for screening for other metabolic disorders: Secondary | ICD-10-CM | POA: Diagnosis not present

## 2023-04-17 LAB — COMPREHENSIVE METABOLIC PANEL
ALT: 20 U/L (ref 0–53)
AST: 18 U/L (ref 0–37)
Albumin: 4.4 g/dL (ref 3.5–5.2)
Alkaline Phosphatase: 65 U/L (ref 39–117)
BUN: 16 mg/dL (ref 6–23)
CO2: 30 meq/L (ref 19–32)
Calcium: 9.3 mg/dL (ref 8.4–10.5)
Chloride: 103 meq/L (ref 96–112)
Creatinine, Ser: 0.75 mg/dL (ref 0.40–1.50)
GFR: 107.82 mL/min (ref >60.00)
Glucose, Bld: 88 mg/dL (ref 70–99)
Potassium: 3.9 meq/L (ref 3.5–5.1)
Sodium: 139 meq/L (ref 135–145)
Total Bilirubin: 0.9 mg/dL (ref 0.2–1.2)
Total Protein: 7.4 g/dL (ref 6.0–8.3)

## 2023-04-17 LAB — HEMOGLOBIN A1C: Hgb A1c MFr Bld: 5.6 % (ref 4.6–6.5)

## 2023-04-17 LAB — TSH: TSH: 1.58 u[IU]/mL (ref 0.35–5.50)

## 2023-04-17 LAB — CBC WITH DIFFERENTIAL/PLATELET
Basophils Absolute: 0 10*3/uL (ref 0.0–0.1)
Basophils Relative: 0.3 % (ref 0.0–3.0)
Eosinophils Absolute: 0 10*3/uL (ref 0.0–0.7)
Eosinophils Relative: 0.5 % (ref 0.0–5.0)
HCT: 43.2 % (ref 39.0–52.0)
Hemoglobin: 14.2 g/dL (ref 13.0–17.0)
Lymphocytes Relative: 46.3 % — ABNORMAL HIGH (ref 12.0–46.0)
Lymphs Abs: 1.9 10*3/uL (ref 0.7–4.0)
MCHC: 32.9 g/dL (ref 30.0–36.0)
MCV: 92.8 fL (ref 78.0–100.0)
Monocytes Absolute: 0.2 10*3/uL (ref 0.1–1.0)
Monocytes Relative: 5.8 % (ref 3.0–12.0)
Neutro Abs: 1.9 10*3/uL (ref 1.4–7.7)
Neutrophils Relative %: 47.1 % (ref 43.0–77.0)
Platelets: 148 10*3/uL — ABNORMAL LOW (ref 150.0–400.0)
RBC: 4.65 Mil/uL (ref 4.22–5.81)
RDW: 13.2 % (ref 11.5–15.5)
WBC: 4.1 10*3/uL (ref 4.0–10.5)

## 2023-04-17 LAB — LIPID PANEL
Cholesterol: 154 mg/dL (ref 0–200)
HDL: 42.2 mg/dL (ref >39.00)
LDL Cholesterol: 105 mg/dL — ABNORMAL HIGH (ref 0–99)
NonHDL: 112.2
Total CHOL/HDL Ratio: 4
Triglycerides: 35 mg/dL (ref 0.0–149.0)
VLDL: 7 mg/dL (ref 0.0–40.0)

## 2023-04-17 NOTE — Progress Notes (Signed)
David Hays 47 y.o.   Chief Complaint  Patient presents with   Annual Exam    No other concerns. Patient states having his appendix and kidney stone removed in May.    HISTORY OF PRESENT ILLNESS: This is a 47 y.o. male here for annual exam Has a history of chronic persistent atrial fibrillation.  On no medications Since our last visit last January he underwent appendectomy and also had trouble with kidney stones. No other complaints or medical concerns today.  HPI   Prior to Admission medications   Medication Sig Start Date End Date Taking? Authorizing Provider  doxycycline (VIBRAMYCIN) 100 MG capsule Take 100 mg by mouth 2 (two) times daily.   Yes [provider]  finasteride (PROPECIA) 1 MG tablet Take 0.25 mg by mouth in the morning. 1/4 tablet 10/19/21  Yes [provider]  Multiple Vitamin (MULTIVITAMIN WITH MINERALS) TABS tablet Take 1 tablet by mouth in the morning. One A Day for Men   Yes [provider]  ZOLMitriptan (ZOMIG) 2.5 MG tablet Take 1 tablet (2.5 mg total) by mouth once for 1 dose. May repeat in 2 hours if headache persists or recurs. 09/24/19 01/12/24 Yes SagardiaEilleen Kempf, MD    No Known Allergies  Patient Active Problem List   Diagnosis Date Noted   Nonspecific abnormal electrocardiogram (ECG) (EKG) 01/19/2022   Persistent atrial fibrillation (HCC) 10/13/2016    Past Medical History:  Diagnosis Date   Headache, cluster    Kidney stone     Past Surgical History:  Procedure Laterality Date   CARDIOVERSION N/A 01/12/2022   Procedure: CARDIOVERSION;  Surgeon: Thurmon Fair, MD;  Location: [facility];  Service: Cardiovascular;  Laterality: N/A;   SPINE SURGERY     THUMB ARTHROSCOPY      Social History   Socioeconomic History   Marital status: Married    Spouse name: Not on file   Number of children: 0   Years of education: Not on file   Highest education level: Not on file  Occupational History    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  Tobacco Use   Smoking status: Never   Smokeless tobacco: Never  Substance and Sexual Activity   Alcohol use: Yes   Drug use: No   Sexual activity: Yes  Other Topics Concern   Not on file  Social History Narrative   Not on file   Social Determinants of Health   Financial Resource Strain: Not on file  Food Insecurity: Low Risk  (10/02/2022)   [facility]   Hunger Vital Sign    Worried About Running Out of Food in the Last Year: Never true    Ran Out of Food in the Last Year: Never true  Transportation Needs: No Transportation Needs (10/02/2022)   [facility]   Transportation    In the past 12 months, has lack of reliable transportation kept you from medical appointments, meetings, work or from getting things needed for daily living? : No  Physical Activity: Not on file  Stress: Not on file  Social Connections: Not on file  Intimate Partner Violence: Not on file    Family History  Problem Relation Age of Onset   Arrhythmia Mother    Hyperlipidemia Father      Review of Systems  Constitutional: Negative.  Negative for chills and fever.  HENT: Negative.  Negative for congestion and sore throat.   Respiratory: Negative.  Negative for cough  and shortness of breath.   Cardiovascular:  Positive for palpitations. Negative for chest pain.  Gastrointestinal:  Negative for abdominal pain, diarrhea, nausea and vomiting.  Genitourinary: Negative.  Negative for dysuria and hematuria.  Skin: Negative.  Negative for rash.  Neurological: Negative.  Negative for dizziness and headaches.  All other systems reviewed and are negative.   Vitals:   04/17/23 0917  BP: 100/68  Pulse: 79  Temp: 97.6 F (36.4 C)  SpO2: 98%    Physical Exam Vitals reviewed.  Constitutional:      Appearance: Normal appearance.  HENT:     Head: Normocephalic.     Mouth/Throat:     Mouth: Mucous membranes are  moist.     Pharynx: Oropharynx is clear.  Eyes:     Extraocular Movements: Extraocular movements intact.     Conjunctiva/sclera: Conjunctivae normal.     Pupils: Pupils are equal, round, and reactive to light.  Cardiovascular:     Rate and Rhythm: Normal rate. Rhythm irregular.  Pulmonary:     Effort: Pulmonary effort is normal.     Breath sounds: Normal breath sounds.  Abdominal:     Palpations: Abdomen is soft.     Tenderness: There is no abdominal tenderness.  Skin:    General: Skin is warm and dry.     Capillary Refill: Capillary refill takes less than 2 seconds.  Neurological:     General: No focal deficit present.     Mental Status: He is alert and oriented to person, place, and time.  Psychiatric:        Mood and Affect: Mood normal.        Behavior: Behavior normal.    EKG: Atrial fibrillation with ventricular rate of 62/min.  ASSESSMENT & PLAN: Problem List Items Addressed This Visit       Cardiovascular and Mediastinum   Persistent atrial fibrillation (HCC)    Presently in atrial fibrillation with a controlled ventricular rate. No anticoagulation at present time. No rate or rhythm control medication at present time. Asymptomatic.  Clinically stable. Recent sleep studies showed no criteria for CPAP treatment. Patient is unsure about ablation treatment. Cardiovascular risks associated with atrial fibrillation discussed. I think he is a good candidate for ablation.  Strongly recommend he gets it.  Needs to follow-up with his cardiologist to discuss further treatments.      Relevant Orders   EKG 12-Lead   Other Visit Diagnoses     Encounter for general adult medical examination with abnormal findings    -  Primary   Relevant Orders   CBC with Differential   Comprehensive metabolic panel   Hemoglobin A1c   Lipid panel   TSH   Need for influenza vaccination       Relevant Orders   Flu vaccine trivalent PF, 6mos and older(Flulaval,Afluria,Fluarix,Fluzone)    Screening for deficiency anemia       Relevant Orders   CBC with Differential   Screening for lipoid disorders       Relevant Orders   Lipid panel   Screening for endocrine, metabolic and immunity disorder       Relevant Orders   Comprehensive metabolic panel   Hemoglobin A1c   TSH      Modifiable risk factors discussed with patient. Anticipatory guidance according to age provided. The following topics were also discussed: Social Determinants of Health Smoking.  Non-smoker Diet and nutrition Benefits of exercise Cancer family history review Vaccinations review and recommendations Cardiovascular risk assessment and  need for blood work The 10-year ASCVD risk score (Arnett DK, et al., 2019) is: 1.3%   Values used to calculate the score:     Age: 68 years     Sex: Male     Is Non-Hispanic African American: No     Diabetic: No     Tobacco smoker: No     Systolic Blood Pressure: 100 mmHg     Is BP treated: No     HDL Cholesterol: 46.9 mg/dL     Total Cholesterol: 165 mg/dL  Mental health including depression and anxiety Fall and accident prevention  Patient Instructions  Health Maintenance, Male Adopting a healthy lifestyle and getting preventive care are important in promoting health and wellness. Ask your health care provider about: The right schedule for you to have regular tests and exams. Things you can do on your own to prevent diseases and keep yourself healthy. What should I know about diet, weight, and exercise? Eat a healthy diet  Eat a diet that includes plenty of vegetables, fruits, low-fat dairy products, and lean protein. Do not eat a lot of foods that are high in solid fats, added sugars, or sodium. Maintain a healthy weight Body mass index (BMI) is a measurement that can be used to identify possible weight problems. It estimates body fat based on height and weight. Your health care provider can help determine your BMI and help you achieve or maintain a  healthy weight. Get regular exercise Get regular exercise. This is one of the most important things you can do for your health. Most adults should: Exercise for at least 150 minutes each week. The exercise should increase your heart rate and make you sweat (moderate-intensity exercise). Do strengthening exercises at least twice a week. This is in addition to the moderate-intensity exercise. Spend less time sitting. Even light physical activity can be beneficial. Watch cholesterol and blood lipids Have your blood tested for lipids and cholesterol at 47 years of age, then have this test every 5 years. You may need to have your cholesterol levels checked more often if: Your lipid or cholesterol levels are high. You are older than 47 years of age. You are at high risk for heart disease. What should I know about cancer screening? Many types of cancers can be detected early and may often be prevented. Depending on your health history and family history, you may need to have cancer screening at various ages. This may include screening for: Colorectal cancer. Prostate cancer. Skin cancer. Lung cancer. What should I know about heart disease, diabetes, and high blood pressure? Blood pressure and heart disease High blood pressure causes heart disease and increases the risk of stroke. This is more likely to develop in people who have high blood pressure readings or are overweight. Talk with your health care provider about your target blood pressure readings. Have your blood pressure checked: Every 3-5 years if you are 2-42 years of age. Every year if you are 80 years old or older. If you are between the ages of 45 and 63 and are a current or former smoker, ask your health care provider if you should have a one-time screening for abdominal aortic aneurysm (AAA). Diabetes Have regular diabetes screenings. This checks your fasting blood sugar level. Have the screening done: Once every three years after  age 56 if you are at a normal weight and have a low risk for diabetes. More often and at a younger age if you are overweight or have  a high risk for diabetes. What should I know about preventing infection? Hepatitis B If you have a higher risk for hepatitis B, you should be screened for this virus. Talk with your health care provider to find out if you are at risk for hepatitis B infection. Hepatitis C Blood testing is recommended for: Everyone born from [birth year] through 1965. Anyone with known risk factors for hepatitis C. Sexually transmitted infections (STIs) You should be screened each year for STIs, including gonorrhea and chlamydia, if: You are sexually active and are younger than 47 years of age. You are older than 47 years of age and your health care provider tells you that you are at risk for this type of infection. Your sexual activity has changed since you were last screened, and you are at increased risk for chlamydia or gonorrhea. Ask your health care provider if you are at risk. Ask your health care provider about whether you are at high risk for HIV. Your health care provider may recommend a prescription medicine to help prevent HIV infection. If you choose to take medicine to prevent HIV, you should first get tested for HIV. You should then be tested every 3 months for as long as you are taking the medicine. Follow these instructions at home: Alcohol use Do not drink alcohol if your health care provider tells you not to drink. If you drink alcohol: Limit how much you have to 0-2 drinks a day. Know how much alcohol is in your drink. In the [place] one drink equals one 12 oz bottle of beer (355 mL), one 5 oz glass of wine (148 mL), or one 1 oz glass of hard liquor (44 mL). Lifestyle Do not use any products that contain nicotine or tobacco. These products include cigarettes, chewing tobacco, and vaping devices, such as e-cigarettes. If you need help quitting, ask your health care  provider. Do not use street drugs. Do not share needles. Ask your health care provider for help if you need support or information about quitting drugs. General instructions Schedule regular health, dental, and eye exams. Stay current with your vaccines. Tell your health care provider if: You often feel depressed. You have ever been abused or do not feel safe at home. Summary Adopting a healthy lifestyle and getting preventive care are important in promoting health and wellness. Follow your health care provider's instructions about healthy diet, exercising, and getting tested or screened for diseases. Follow your health care provider's instructions on monitoring your cholesterol and blood pressure. This information is not intended to replace advice given to you by your health care provider. Make sure you discuss any questions you have with your health care provider. Document Revised: 09/28/2020 Document Reviewed: 09/28/2020 Elsevier Patient Education  2024 Elsevier Inc.      Edwina Barth, MD [facility]

## 2023-04-17 NOTE — Assessment & Plan Note (Signed)
Presently in atrial fibrillation with a controlled ventricular rate. No anticoagulation at present time. No rate or rhythm control medication at present time. Asymptomatic.  Clinically stable. Recent sleep studies showed no criteria for CPAP treatment. Patient is unsure about ablation treatment. Cardiovascular risks associated with atrial fibrillation discussed. I think he is a good candidate for ablation.  Strongly recommend he gets it.  Needs to follow-up with his cardiologist to discuss further treatments.

## 2023-04-17 NOTE — Patient Instructions (Signed)
Health Maintenance, Male Adopting a healthy lifestyle and getting preventive care are important in promoting health and wellness. Ask your health care provider about: The right schedule for you to have regular tests and exams. Things you can do on your own to prevent diseases and keep yourself healthy. What should I know about diet, weight, and exercise? Eat a healthy diet  Eat a diet that includes plenty of vegetables, fruits, low-fat dairy products, and lean protein. Do not eat a lot of foods that are high in solid fats, added sugars, or sodium. Maintain a healthy weight Body mass index (BMI) is a measurement that can be used to identify possible weight problems. It estimates body fat based on height and weight. Your health care provider can help determine your BMI and help you achieve or maintain a healthy weight. Get regular exercise Get regular exercise. This is one of the most important things you can do for your health. Most adults should: Exercise for at least 150 minutes each week. The exercise should increase your heart rate and make you sweat (moderate-intensity exercise). Do strengthening exercises at least twice a week. This is in addition to the moderate-intensity exercise. Spend less time sitting. Even light physical activity can be beneficial. Watch cholesterol and blood lipids Have your blood tested for lipids and cholesterol at 47 years of age, then have this test every 5 years. You may need to have your cholesterol levels checked more often if: Your lipid or cholesterol levels are high. You are older than 47 years of age. You are at high risk for heart disease. What should I know about cancer screening? Many types of cancers can be detected early and may often be prevented. Depending on your health history and family history, you may need to have cancer screening at various ages. This may include screening for: Colorectal cancer. Prostate cancer. Skin cancer. Lung  cancer. What should I know about heart disease, diabetes, and high blood pressure? Blood pressure and heart disease High blood pressure causes heart disease and increases the risk of stroke. This is more likely to develop in people who have high blood pressure readings or are overweight. Talk with your health care provider about your target blood pressure readings. Have your blood pressure checked: Every 3-5 years if you are 18-39 years of age. Every year if you are 40 years old or older. If you are between the ages of 65 and 75 and are a current or former smoker, ask your health care provider if you should have a one-time screening for abdominal aortic aneurysm (AAA). Diabetes Have regular diabetes screenings. This checks your fasting blood sugar level. Have the screening done: Once every three years after age 45 if you are at a normal weight and have a low risk for diabetes. More often and at a younger age if you are overweight or have a high risk for diabetes. What should I know about preventing infection? Hepatitis B If you have a higher risk for hepatitis B, you should be screened for this virus. Talk with your health care provider to find out if you are at risk for hepatitis B infection. Hepatitis C Blood testing is recommended for: Everyone born from 1945 through 1965. Anyone with known risk factors for hepatitis C. Sexually transmitted infections (STIs) You should be screened each year for STIs, including gonorrhea and chlamydia, if: You are sexually active and are younger than 47 years of age. You are older than 47 years of age and your   health care provider tells you that you are at risk for this type of infection. Your sexual activity has changed since you were last screened, and you are at increased risk for chlamydia or gonorrhea. Ask your health care provider if you are at risk. Ask your health care provider about whether you are at high risk for HIV. Your health care provider  may recommend a prescription medicine to help prevent HIV infection. If you choose to take medicine to prevent HIV, you should first get tested for HIV. You should then be tested every 3 months for as long as you are taking the medicine. Follow these instructions at home: Alcohol use Do not drink alcohol if your health care provider tells you not to drink. If you drink alcohol: Limit how much you have to 0-2 drinks a day. Know how much alcohol is in your drink. In the [place] one drink equals one 12 oz bottle of beer (355 mL), one 5 oz glass of wine (148 mL), or one 1 oz glass of hard liquor (44 mL). Lifestyle Do not use any products that contain nicotine or tobacco. These products include cigarettes, chewing tobacco, and vaping devices, such as e-cigarettes. If you need help quitting, ask your health care provider. Do not use street drugs. Do not share needles. Ask your health care provider for help if you need support or information about quitting drugs. General instructions Schedule regular health, dental, and eye exams. Stay current with your vaccines. Tell your health care provider if: You often feel depressed. You have ever been abused or do not feel safe at home. Summary Adopting a healthy lifestyle and getting preventive care are important in promoting health and wellness. Follow your health care provider's instructions about healthy diet, exercising, and getting tested or screened for diseases. Follow your health care provider's instructions on monitoring your cholesterol and blood pressure. This information is not intended to replace advice given to you by your health care provider. Make sure you discuss any questions you have with your health care provider. Document Revised: 09/28/2020 Document Reviewed: 09/28/2020 Elsevier Patient Education  2024 Elsevier Inc.  

## 2023-05-29 NOTE — Progress Notes (Signed)
 Cardiology Office Note   Date:  05/31/2023  ID:  David Hays, DOB 1975/12/08, MRN [id number] PCP:  Purcell Emil Schanz, MD [facility] Cardiologist: None  Reason for visit: 6 follow-up  History of Present Illness    David Hays is a 48 y.o. male with a hx of atrial fibrillation.  He states diagnosis was first picked up on annual physical.  He initially saw Dr. Jordan for A-fib in 2018 (A-fib with HR 85, A-flutter with HR 137 --> then converted to NSR).  A-fib picked up again on routine physical in 2023.  Zio patch x3 days 12/30/2021 showed 100%  A-fib burden, average rate 109 bpm.  Underwent successful cardioversion 01/12/2022.    He was last seen by Dr. Kate in August 2023.  Denies palpitations and felt less fatigued.  With reports of snoring/daytime somnolence, Sleep study ordered and reportedly normal.  Referred to EP for consideration of ablation.  Patient was not on long-term anticoagulation for CHA2DS2-VASc score of 0.  He was seen by Dr. Inocencio in September 2023.  Patient given information about A-fib ablation and also discussed rhythm management with flecainide - pt was supposed to contact office with decision.  Today, patient states he feels good.  He states after being diagnosed with atrial fibrillation, he has greatly changed his lifestyle.  He decreased his caffeine intake and alcohol intake.  He states his only medical history is taking periodic doxycycline for rosacea.  He was a smoker for approximately 5 years in his early 58s.  His PCP recommended he return today given persistent atrial fibrillation.  The patient states he is hesitant about invasive procedures given that he feels he is asymptomatic.  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  He uses a stand-up desk and a walking mat.  He golfs.  He states he has no shortness of breath, chest pain, significant lightheadedness and lower extremity edema.  He feels palpitations but wonders if it is  related to mental stress.  He states has chronic fatigue between work and having 2 small children.  He cannot remember if he felt significant symptom difference after his successful cardioversion previously.   Objective / Physical Exam   EKG today: Atrial fibrillation with heart rate 72  Vital signs:  BP 98/86 (BP Location: Left Arm, Patient Position: Sitting)   Pulse 72   Ht 6' 3 (1.905 m)   Wt 185 lb 6.4 oz (84.1 kg)   SpO2 98%   BMI 23.17 kg/m     GEN: No acute distress NECK: No carotid bruits CARDIAC: Irregular irregular, no murmurs RESPIRATORY:  Clear to auscultation without rales, wheezing or rhonchi  EXTREMITIES: No edema  Assessment and Plan   Persistent atrial fibrillation -Saw Dr. Jordan for A-fib in 2018 (A-fib with HR 85, A-flutter with HR 137 --> then converted to NSR -Zio patch x3 days 12/30/2021 showed 100%  A-fib burden, average rate 109 bpm.  Underwent successful cardioversion 01/12/2022.   -Today's EKG shows atrial fibrillation with heart rate 72. -Echo 2023 with EF 60-65%, mild LVH, normal RV, mild MR, normal left atrial size -Reported normal sleep study -Recent TSH WNL -Pt has decreased ETOH & caffeine intake -He states he is asymptomatic though mentions palpitations and fatigue. --> Recommend referral to Dr. Ole Holts for consideration of A-fib ablation.  I think it would be helpful for him to hear a second opinion regarding A-fib management.  We talked about how is more difficult to treat  A-fib the longer you are out of rhythm.  We also discussed complications of A-fib including heart failure.  He is nervous about success rates and possible redo ablation.  I told him he is a great candidate given normal EF and normal left atrial size & no significant comorbidities. -With CHA2DS2-VASc score of 0, he is not on anticoagulation. -At baseline heart rate is controlled without medication.  Disposition - Refer to EP to discuss A-fib  ablation.  Signed, Delon MARLA Holts, PA-C  05/31/2023 [facility]

## 2023-05-31 ENCOUNTER — Ambulatory Visit: Payer: No Typology Code available for payment source | Attending: Physician Assistant | Admitting: Physician Assistant

## 2023-05-31 ENCOUNTER — Encounter: Payer: Self-pay | Admitting: Physician Assistant

## 2023-05-31 VITALS — BP 98/86 | HR 72 | Ht 75.0 in | Wt 185.4 lb

## 2023-05-31 DIAGNOSIS — I4819 Other persistent atrial fibrillation: Secondary | ICD-10-CM

## 2023-05-31 NOTE — Patient Instructions (Signed)
 Medication Instructions:  No changes *If you need a refill on your cardiac medications before your next appointment, please call your pharmacy*   Lab Work: No labs If you have labs (blood work) drawn today and your tests are completely normal, you will receive your results only by: MyChart Message (if you have MyChart) OR A paper copy in the mail If you have any lab test that is abnormal or we need to change your treatment, we will call you to review the results.   Testing/Procedures: No Testing   Follow-Up: At [facility], you and your health needs are our priority.  As part of our continuing mission to provide you with exceptional heart care, we have created designated Provider Care Teams.  These Care Teams include your primary Cardiologist (physician) and Advanced Practice Providers (APPs -  Physician Assistants and Nurse Practitioners) who all work together to provide you with the care you need, when you need it.  We recommend signing up for the patient portal called MyChart.  Sign up information is provided on this After Visit Summary.  MyChart is used to connect with patients for Virtual Visits (Telemedicine).  Patients are able to view lab/test results, encounter notes, upcoming appointments, etc.  Non-urgent messages can be sent to your provider as well.   To learn more about what you can do with MyChart, go to [email or web address].    Your next appointment:   Follow Up As Needed  Provider:   Lonni Nanas, MD

## 2023-07-13 NOTE — Progress Notes (Unsigned)
 Electrophysiology Office Follow up Visit Note:    Date:  07/14/2023   ID:  David Hays, DOB 1976/02/23, MRN [id number]  PCP:  Georgina Quint, MD  [facility] Cardiologist:  None  [facility] Electrophysiologist:  Lanier Prude, MD    Interval History:     David Hays is a 48 y.o. male who presents for a follow up visit.   I am seeing Mr. David Hays today at the request of Juanda Crumble, PA-C for a second opinion regarding his atrial fibrillation.  He has a history of persistent atrial fibrillation.  He has a CHA2DS2-VASc of 0 and is not maintained on anticoagulation.  During the appointment with Victorino Dike, treatment strategies for atrial fibrillation were discussed.  The patient reported being asymptomatic with his atrial fibrillation during his appointment January 8.  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  He golfs and has no shortness of breath, chest pain, lightheadedness, edema.  He does feel palpitations.  He thinks his chronic fatigue is related to having 2 children.  He is doing well today.  He is very active.  He reports no symptoms associated with his atrial fibrillation.         Past medical, surgical, social and family history were reviewed.  ROS:   Please see the history of present illness.    All other systems reviewed and are negative.  EKGs/Labs/Other Studies Reviewed:    The following studies were reviewed today:  January 19, 2022 ZIO monitor showed 100% atrial fibrillation, average heart rate 109 bpm Rare ventricular ectopy  December 16, 2021 echo EF 60-65 RV normal Mild MR  EKG from Oct 13, 2016 shows atrial fibrillation with a controlled ventricular rate  May 31, 2023 EKG reviewed and shows atrial fibrillation with controlled ventricular rate  January 15, 2018 EKG shows atrial fibrillation  January 12, 2022 EKG shows sinus rhythm  January 19, 2022 EKG shows sinus rhythm       Physical Exam:    VS:  BP  108/68   Pulse 84   Ht 6\' 3"  (1.905 m)   Wt 186 lb 3.2 oz (84.5 kg)   SpO2 97%   BMI 23.27 kg/m     Wt Readings from Last 3 Encounters:  07/14/23 186 lb 3.2 oz (84.5 kg)  05/31/23 185 lb 6.4 oz (84.1 kg)  04/17/23 224 lb (101.6 kg)     GEN: no distress.  Thin.  Tall. CARD: Irregularly irregular, No MRG RESP: No IWOB. CTAB.      ASSESSMENT:    No diagnosis found. PLAN:    In order of problems listed above:  #Persistent atrial fibrillation Minimally symptomatic.  Rate control could be better with an average heart rate of 109 during an August 2023 ZIO monitor.  His CHA2DS2-VASc is 0.  I discussed treatment options with the patient including continued conservative management, antiarrhythmic drugs and catheter ablation.  I did discuss how the chronicity of his atrial fibrillation may impact the success rate of any rhythm control strategy.  I suspect he has been out of rhythm at least since 2018 and potentially much longer given the average heart rate of his A-fib being relatively low off any nodal agents.  For now we have mutually decided to continue with a conservative management strategy.  I would recommend annual follow-up to reassess his stroke risk moving forward.  He understands that this will change with time and at some point will likely prompt the addition  of an anticoagulant.  For now though, I have encouraged him to stay active.  Follow-up 1 year with APP.     Signed, Steffanie Dunn, MD, [facility], [facility] 07/14/2023 9:41 AM    Electrophysiology [facility]

## 2023-07-14 ENCOUNTER — Ambulatory Visit: Payer: No Typology Code available for payment source | Attending: Cardiology | Admitting: Cardiology

## 2023-07-14 ENCOUNTER — Encounter: Payer: Self-pay | Admitting: Cardiology

## 2023-07-14 VITALS — BP 108/68 | HR 84 | Ht 75.0 in | Wt 186.2 lb

## 2023-07-14 DIAGNOSIS — I4821 Permanent atrial fibrillation: Secondary | ICD-10-CM | POA: Diagnosis not present

## 2023-07-14 NOTE — Patient Instructions (Signed)
 Medication Instructions:  Your physician recommends that you continue on your current medications as directed. Please refer to the Current Medication list given to you today.  *If you need a refill on your cardiac medications before your next appointment, please call your pharmacy*  Follow-Up: At [facility], you and your health needs are our priority.  As part of our continuing mission to provide you with exceptional heart care, we have created designated Provider Care Teams.  These Care Teams include your primary Cardiologist (physician) and Advanced Practice Providers (APPs -  Physician Assistants and Nurse Practitioners) who all work together to provide you with the care you need, when you need it.  Your next appointment:   1 year  Provider:   You will see one of the following Advanced Practice Providers on your designated Care Team:   Francis Dowse, Charlott Holler [address] [place], [place] Sherie Don, NP Canary Brim, NP

## 2024-04-16 ENCOUNTER — Ambulatory Visit (INDEPENDENT_AMBULATORY_CARE_PROVIDER_SITE_OTHER): Payer: No Typology Code available for payment source | Admitting: Emergency Medicine

## 2024-04-16 ENCOUNTER — Ambulatory Visit: Payer: Self-pay | Admitting: Emergency Medicine

## 2024-04-16 ENCOUNTER — Encounter: Payer: Self-pay | Admitting: Emergency Medicine

## 2024-04-16 VITALS — BP 100/80 | HR 64 | Temp 98.1°F | Ht 77.0 in | Wt 190.0 lb

## 2024-04-16 DIAGNOSIS — Z0000 Encounter for general adult medical examination without abnormal findings: Secondary | ICD-10-CM | POA: Diagnosis not present

## 2024-04-16 DIAGNOSIS — Z125 Encounter for screening for malignant neoplasm of prostate: Secondary | ICD-10-CM | POA: Diagnosis not present

## 2024-04-16 DIAGNOSIS — Z13220 Encounter for screening for lipoid disorders: Secondary | ICD-10-CM

## 2024-04-16 DIAGNOSIS — Z1329 Encounter for screening for other suspected endocrine disorder: Secondary | ICD-10-CM

## 2024-04-16 DIAGNOSIS — I4819 Other persistent atrial fibrillation: Secondary | ICD-10-CM

## 2024-04-16 DIAGNOSIS — Z23 Encounter for immunization: Secondary | ICD-10-CM

## 2024-04-16 DIAGNOSIS — Z0001 Encounter for general adult medical examination with abnormal findings: Secondary | ICD-10-CM

## 2024-04-16 DIAGNOSIS — Z130 Encounter for screening for diseases of the blood and blood-forming organs and certain disorders involving the immune mechanism: Secondary | ICD-10-CM

## 2024-04-16 DIAGNOSIS — Z1211 Encounter for screening for malignant neoplasm of colon: Secondary | ICD-10-CM

## 2024-04-16 DIAGNOSIS — Z13228 Encounter for screening for other metabolic disorders: Secondary | ICD-10-CM

## 2024-04-16 LAB — COMPREHENSIVE METABOLIC PANEL WITH GFR
ALT: 23 U/L (ref 0–53)
AST: 19 U/L (ref 0–37)
Albumin: 4.5 g/dL (ref 3.5–5.2)
Alkaline Phosphatase: 58 U/L (ref 39–117)
BUN: 17 mg/dL (ref 6–23)
CO2: 31 meq/L (ref 19–32)
Calcium: 9.2 mg/dL (ref 8.4–10.5)
Chloride: 104 meq/L (ref 96–112)
Creatinine, Ser: 0.7 mg/dL (ref 0.40–1.50)
GFR: 109.32 mL/min (ref >60.00)
Glucose, Bld: 90 mg/dL (ref 70–99)
Potassium: 4.2 meq/L (ref 3.5–5.1)
Sodium: 140 meq/L (ref 135–145)
Total Bilirubin: 0.9 mg/dL (ref 0.2–1.2)
Total Protein: 7.2 g/dL (ref 6.0–8.3)

## 2024-04-16 LAB — LIPID PANEL
Cholesterol: 149 mg/dL (ref 0–200)
HDL: 46.6 mg/dL (ref >39.00)
LDL Cholesterol: 95 mg/dL (ref 0–99)
NonHDL: 102.86
Total CHOL/HDL Ratio: 3
Triglycerides: 37 mg/dL (ref 0.0–149.0)
VLDL: 7.4 mg/dL (ref 0.0–40.0)

## 2024-04-16 LAB — CBC WITH DIFFERENTIAL/PLATELET
Basophils Absolute: 0 K/uL (ref 0.0–0.1)
Basophils Relative: 0.3 % (ref 0.0–3.0)
Eosinophils Absolute: 0 K/uL (ref 0.0–0.7)
Eosinophils Relative: 0.8 % (ref 0.0–5.0)
HCT: 42.9 % (ref 39.0–52.0)
Hemoglobin: 14.3 g/dL (ref 13.0–17.0)
Lymphocytes Relative: 38.9 % (ref 12.0–46.0)
Lymphs Abs: 1.7 K/uL (ref 0.7–4.0)
MCHC: 33.4 g/dL (ref 30.0–36.0)
MCV: 91.1 fl (ref 78.0–100.0)
Monocytes Absolute: 0.3 K/uL (ref 0.1–1.0)
Monocytes Relative: 6.1 % (ref 3.0–12.0)
Neutro Abs: 2.4 K/uL (ref 1.4–7.7)
Neutrophils Relative %: 53.9 % (ref 43.0–77.0)
Platelets: 145 K/uL — ABNORMAL LOW (ref 150.0–400.0)
RBC: 4.71 Mil/uL (ref 4.22–5.81)
RDW: 13.5 % (ref 11.5–15.5)
WBC: 4.5 K/uL (ref 4.0–10.5)

## 2024-04-16 LAB — TSH: TSH: 1.32 u[IU]/mL (ref 0.35–5.50)

## 2024-04-16 LAB — VITAMIN B12: Vitamin B-12: 430 pg/mL (ref 211–911)

## 2024-04-16 LAB — VITAMIN D 25 HYDROXY (VIT D DEFICIENCY, FRACTURES): VITD: 13.32 ng/mL — ABNORMAL LOW (ref 30.00–100.00)

## 2024-04-16 LAB — HEMOGLOBIN A1C: Hgb A1c MFr Bld: 5.2 % (ref 4.6–6.5)

## 2024-04-16 LAB — PSA: PSA: 0.08 ng/mL — ABNORMAL LOW (ref 0.10–4.00)

## 2024-04-16 NOTE — Assessment & Plan Note (Signed)
 Presently in atrial fibrillation with a controlled ventricular rate. No anticoagulation at present time. No rate or rhythm control medication at present time. Asymptomatic.  Clinically stable. Recent sleep studies showed no criteria for CPAP treatment. Last cardiologist office visit assessment and plan as follows: ASSESSMENT:     No diagnosis found. PLAN:     In order of problems listed above:   #Persistent atrial fibrillation Minimally symptomatic.  Rate control could be better with an average heart rate of 109 during an August 2023 ZIO monitor.  His CHA2DS2-VASc is 0.   I discussed treatment options with the patient including continued conservative management, antiarrhythmic drugs and catheter ablation.  I did discuss how the chronicity of his atrial fibrillation may impact the success rate of any rhythm control strategy.  I suspect he has been out of rhythm at least since 2018 and potentially much longer given the average heart rate of his A-fib being relatively low off any nodal agents.   For now we have mutually decided to continue with a conservative management strategy.  I would recommend annual follow-up to reassess his stroke risk moving forward.  He understands that this will change with time and at some point will likely prompt the addition of an anticoagulant.  For now though, I have encouraged him to stay active.   Follow-up 1 year with APP.

## 2024-04-16 NOTE — Progress Notes (Signed)
 David Hays 48 y.o.   Chief Complaint  Patient presents with   Annual Exam    HISTORY OF PRESENT ILLNESS: This is a 48 y.o. male here for annual exam. History of long-term persistent atrial fibrillation.  No treatment at present time Sees cardiologist on a regular basis Overall doing well.  Has no complaints or medical concerns today.  HPI   Prior to Admission medications   Medication Sig Start Date End Date Taking? Authorizing Provider  finasteride (PROPECIA) 1 MG tablet Take 0.25 mg by mouth in the morning. 1/4 tablet 10/19/21  Yes [provider]  Multiple Vitamin (MULTIVITAMIN WITH MINERALS) TABS tablet Take 1 tablet by mouth in the morning. One A Day for Men   Yes [provider]  ZOLMitriptan  (ZOMIG ) 2.5 MG tablet Take 1 tablet (2.5 mg total) by mouth once for 1 dose. May repeat in 2 hours if headache persists or recurs. 09/24/19 04/16/24 Yes SagardiaEmil Schanz, MD    No Known Allergies  Patient Active Problem List   Diagnosis Date Noted   Nonspecific abnormal electrocardiogram (ECG) (EKG) 01/19/2022   Persistent atrial fibrillation (HCC) 10/13/2016    Past Medical History:  Diagnosis Date   Headache, cluster    Kidney stone     Past Surgical History:  Procedure Laterality Date   CARDIOVERSION N/A 01/12/2022   Procedure: CARDIOVERSION;  Surgeon: Francyne Headland, MD;  Location: [facility];  Service: Cardiovascular;  Laterality: N/A;   SPINE SURGERY     THUMB ARTHROSCOPY      Social History   Socioeconomic History   Marital status: Married    Spouse name: Not on file   Number of children: 0   Years of education: Not on file   Highest education level: Not on file  Occupational History   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  Tobacco Use   Smoking status: Never   Smokeless tobacco: Never  Substance and Sexual Activity   Alcohol use: Yes   Drug use: No   Sexual activity: Yes  Other Topics Concern   Not on file  Social History  Narrative   Not on file   Social Drivers of Health   Financial Resource Strain: Not on file  Food Insecurity: Low Risk  (10/02/2022)   Received from [facility]   Hunger Vital Sign    Within the past 12 months, you worried that your food would run out before you got money to buy more: Never true    Within the past 12 months, the food you bought just didn't last and you didn't have money to get more. : Never true  Transportation Needs: No Transportation Needs (10/02/2022)   Received from [facility]    In the past 12 months, has lack of reliable transportation kept you from medical appointments, meetings, work or from getting things needed for daily living? : No  Physical Activity: Not on file  Stress: Not on file  Social Connections: Not on file  Intimate Partner Violence: Not on file    Family History  Problem Relation Age of Onset   Arrhythmia Mother    Hyperlipidemia Father      Review of Systems  Constitutional: Negative.  Negative for chills and fever.  HENT: Negative.  Negative for congestion and sore throat.   Respiratory: Negative.  Negative for cough and shortness of breath.   Cardiovascular: Negative.  Negative for chest pain and palpitations.  Gastrointestinal:  Negative for abdominal pain, diarrhea, nausea and  vomiting.  Genitourinary: Negative.  Negative for dysuria and hematuria.  Skin: Negative.  Negative for rash.  Neurological: Negative.  Negative for dizziness and headaches.  All other systems reviewed and are negative.   Today's Vitals   04/16/24 0950  BP: 100/80  Pulse: 64  Temp: 98.1 F (36.7 C)  TempSrc: Oral  SpO2: 98%  Weight: 190 lb (86.2 kg)  Height: 6' 5 (1.956 m)   Body mass index is 22.53 kg/m.   Physical Exam Vitals reviewed.  Constitutional:      Appearance: Normal appearance.  HENT:     Head: Normocephalic.     Right Ear: Tympanic membrane, ear canal and external ear normal.     Left Ear: Tympanic  membrane, ear canal and external ear normal.     Mouth/Throat:     Mouth: Mucous membranes are moist.     Pharynx: Oropharynx is clear.  Eyes:     Extraocular Movements: Extraocular movements intact.     Conjunctiva/sclera: Conjunctivae normal.     Pupils: Pupils are equal, round, and reactive to light.  Cardiovascular:     Rate and Rhythm: Normal rate. Rhythm irregular.     Pulses: Normal pulses.     Heart sounds: Normal heart sounds.  Pulmonary:     Effort: Pulmonary effort is normal.     Breath sounds: Normal breath sounds.  Abdominal:     Palpations: Abdomen is soft.     Tenderness: There is no abdominal tenderness.  Musculoskeletal:     Cervical back: No tenderness.     Right lower leg: No edema.     Left lower leg: No edema.  Lymphadenopathy:     Cervical: No cervical adenopathy.  Skin:    General: Skin is warm and dry.     Capillary Refill: Capillary refill takes less than 2 seconds.  Neurological:     General: No focal deficit present.     Mental Status: He is alert and oriented to person, place, and time.  Psychiatric:        Mood and Affect: Mood normal.        Behavior: Behavior normal.      ASSESSMENT & PLAN: Problem List Items Addressed This Visit       Cardiovascular and Mediastinum   Persistent atrial fibrillation (HCC)   Presently in atrial fibrillation with a controlled ventricular rate. No anticoagulation at present time. No rate or rhythm control medication at present time. Asymptomatic.  Clinically stable. Recent sleep studies showed no criteria for CPAP treatment. Last cardiologist office visit assessment and plan as follows: ASSESSMENT:     No diagnosis found. PLAN:     In order of problems listed above:   #Persistent atrial fibrillation Minimally symptomatic.  Rate control could be better with an average heart rate of 109 during an August 2023 ZIO monitor.  His CHA2DS2-VASc is 0.   I discussed treatment options with the patient  including continued conservative management, antiarrhythmic drugs and catheter ablation.  I did discuss how the chronicity of his atrial fibrillation may impact the success rate of any rhythm control strategy.  I suspect he has been out of rhythm at least since 2018 and potentially much longer given the average heart rate of his A-fib being relatively low off any nodal agents.   For now we have mutually decided to continue with a conservative management strategy.  I would recommend annual follow-up to reassess his stroke risk moving forward.  He understands that this will  change with time and at some point will likely prompt the addition of an anticoagulant.  For now though, I have encouraged him to stay active.   Follow-up 1 year with APP.      Other Visit Diagnoses       Encounter for general adult medical examination with abnormal findings    -  Primary   Relevant Orders   CBC with Differential/Platelet   Comprehensive metabolic panel with GFR   Hemoglobin A1c   Lipid panel   TSH   Vitamin B12   VITAMIN D  25 Hydroxy (Vit-D Deficiency, Fractures)   PSA     Flu vaccine need         Screening for deficiency anemia       Relevant Orders   CBC with Differential/Platelet     Screening for lipoid disorders       Relevant Orders   Lipid panel     Screening for endocrine, metabolic and immunity disorder       Relevant Orders   Comprehensive metabolic panel with GFR   Hemoglobin A1c   TSH   Vitamin B12   VITAMIN D  25 Hydroxy (Vit-D Deficiency, Fractures)     Screening for prostate cancer       Relevant Orders   PSA     Screening for colon cancer       Relevant Orders   Cologuard        Modifiable risk factors discussed with patient. Anticipatory guidance according to age provided. The following topics were also discussed: Social Determinants of Health Smoking.  Non-smoker Diet and nutrition Benefits of exercise Cancer screening and need for colon cancer screening.   Recommend Cologuard. Vaccinations review and recommendations Cardiovascular risk assessment and need for blood work The 10-year ASCVD risk score (Arnett DK, et al., 2019) is: 1.5%   Values used to calculate the score:     Age: 3 years     Clincally relevant sex: Male     Is Non-Hispanic African American: No     Diabetic: No     Tobacco smoker: No     Systolic Blood Pressure: 100 mmHg     Is BP treated: No     HDL Cholesterol: 42.2 mg/dL     Total Cholesterol: 154 mg/dL  Mental health including depression and anxiety Fall and accident prevention  Patient Instructions  Health Maintenance, Male Adopting a healthy lifestyle and getting preventive care are important in promoting health and wellness. Ask your health care provider about: The right schedule for you to have regular tests and exams. Things you can do on your own to prevent diseases and keep yourself healthy. What should I know about diet, weight, and exercise? Eat a healthy diet  Eat a diet that includes plenty of vegetables, fruits, low-fat dairy products, and lean protein. Do not eat a lot of foods that are high in solid fats, added sugars, or sodium. Maintain a healthy weight Body mass index (BMI) is a measurement that can be used to identify possible weight problems. It estimates body fat based on height and weight. Your health care provider can help determine your BMI and help you achieve or maintain a healthy weight. Get regular exercise Get regular exercise. This is one of the most important things you can do for your health. Most adults should: Exercise for at least 150 minutes each week. The exercise should increase your heart rate and make you sweat (moderate-intensity exercise). Do strengthening exercises at least twice  a week. This is in addition to the moderate-intensity exercise. Spend less time sitting. Even light physical activity can be beneficial. Watch cholesterol and blood lipids Have your blood tested  for lipids and cholesterol at 48 years of age, then have this test every 5 years. You may need to have your cholesterol levels checked more often if: Your lipid or cholesterol levels are high. You are older than 48 years of age. You are at high risk for heart disease. What should I know about cancer screening? Many types of cancers can be detected early and may often be prevented. Depending on your health history and family history, you may need to have cancer screening at various ages. This may include screening for: Colorectal cancer. Prostate cancer. Skin cancer. Lung cancer. What should I know about heart disease, diabetes, and high blood pressure? Blood pressure and heart disease High blood pressure causes heart disease and increases the risk of stroke. This is more likely to develop in people who have high blood pressure readings or are overweight. Talk with your health care provider about your target blood pressure readings. Have your blood pressure checked: Every 3-5 years if you are 48-13 years of age. Every year if you are 72 years old or older. If you are between the ages of 50 and 68 and are a current or former smoker, ask your health care provider if you should have a one-time screening for abdominal aortic aneurysm (AAA). Diabetes Have regular diabetes screenings. This checks your fasting blood sugar level. Have the screening done: Once every three years after age 58 if you are at a normal weight and have a low risk for diabetes. More often and at a younger age if you are overweight or have a high risk for diabetes. What should I know about preventing infection? Hepatitis B If you have a higher risk for hepatitis B, you should be screened for this virus. Talk with your health care provider to find out if you are at risk for hepatitis B infection. Hepatitis C Blood testing is recommended for: Everyone born from [birth year] through 1965. Anyone with known risk factors for hepatitis  C. Sexually transmitted infections (STIs) You should be screened each year for STIs, including gonorrhea and chlamydia, if: You are sexually active and are younger than 48 years of age. You are older than 48 years of age and your health care provider tells you that you are at risk for this type of infection. Your sexual activity has changed since you were last screened, and you are at increased risk for chlamydia or gonorrhea. Ask your health care provider if you are at risk. Ask your health care provider about whether you are at high risk for HIV. Your health care provider may recommend a prescription medicine to help prevent HIV infection. If you choose to take medicine to prevent HIV, you should first get tested for HIV. You should then be tested every 3 months for as long as you are taking the medicine. Follow these instructions at home: Alcohol use Do not drink alcohol if your health care provider tells you not to drink. If you drink alcohol: Limit how much you have to 0-2 drinks a day. Know how much alcohol is in your drink. In the [place] one drink equals one 12 oz bottle of beer (355 mL), one 5 oz glass of wine (148 mL), or one 1 oz glass of hard liquor (44 mL). Lifestyle Do not use any products that contain nicotine or  tobacco. These products include cigarettes, chewing tobacco, and vaping devices, such as e-cigarettes. If you need help quitting, ask your health care provider. Do not use street drugs. Do not share needles. Ask your health care provider for help if you need support or information about quitting drugs. General instructions Schedule regular health, dental, and eye exams. Stay current with your vaccines. Tell your health care provider if: You often feel depressed. You have ever been abused or do not feel safe at home. Summary Adopting a healthy lifestyle and getting preventive care are important in promoting health and wellness. Follow your health care provider's  instructions about healthy diet, exercising, and getting tested or screened for diseases. Follow your health care provider's instructions on monitoring your cholesterol and blood pressure. This information is not intended to replace advice given to you by your health care provider. Make sure you discuss any questions you have with your health care provider. Document Revised: 09/28/2020 Document Reviewed: 09/28/2020 Elsevier Patient Education  2024 [employer]      Emil Schaumann, MD [facility]

## 2024-04-16 NOTE — Patient Instructions (Signed)
 Health Maintenance, Male  Adopting a healthy lifestyle and getting preventive care are important in promoting health and wellness. Ask your health care provider about:  The right schedule for you to have regular tests and exams.  Things you can do on your own to prevent diseases and keep yourself healthy.  What should I know about diet, weight, and exercise?  Eat a healthy diet    Eat a diet that includes plenty of vegetables, fruits, low-fat dairy products, and lean protein.  Do not eat a lot of foods that are high in solid fats, added sugars, or sodium.  Maintain a healthy weight  Body mass index (BMI) is a measurement that can be used to identify possible weight problems. It estimates body fat based on height and weight. Your health care provider can help determine your BMI and help you achieve or maintain a healthy weight.  Get regular exercise  Get regular exercise. This is one of the most important things you can do for your health. Most adults should:  Exercise for at least 150 minutes each week. The exercise should increase your heart rate and make you sweat (moderate-intensity exercise).  Do strengthening exercises at least twice a week. This is in addition to the moderate-intensity exercise.  Spend less time sitting. Even light physical activity can be beneficial.  Watch cholesterol and blood lipids  Have your blood tested for lipids and cholesterol at 48 years of age, then have this test every 5 years.  You may need to have your cholesterol levels checked more often if:  Your lipid or cholesterol levels are high.  You are older than 48 years of age.  You are at high risk for heart disease.  What should I know about cancer screening?  Many types of cancers can be detected early and may often be prevented. Depending on your health history and family history, you may need to have cancer screening at various ages. This may include screening for:  Colorectal cancer.  Prostate cancer.  Skin cancer.  Lung  cancer.  What should I know about heart disease, diabetes, and high blood pressure?  Blood pressure and heart disease  High blood pressure causes heart disease and increases the risk of stroke. This is more likely to develop in people who have high blood pressure readings or are overweight.  Talk with your health care provider about your target blood pressure readings.  Have your blood pressure checked:  Every 3-5 years if you are 24-52 years of age.  Every year if you are 3 years old or older.  If you are between the ages of 60 and 72 and are a current or former smoker, ask your health care provider if you should have a one-time screening for abdominal aortic aneurysm (AAA).  Diabetes  Have regular diabetes screenings. This checks your fasting blood sugar level. Have the screening done:  Once every three years after age 66 if you are at a normal weight and have a low risk for diabetes.  More often and at a younger age if you are overweight or have a high risk for diabetes.  What should I know about preventing infection?  Hepatitis B  If you have a higher risk for hepatitis B, you should be screened for this virus. Talk with your health care provider to find out if you are at risk for hepatitis B infection.  Hepatitis C  Blood testing is recommended for:  Everyone born from [birth year] through 1965.  Anyone  with known risk factors for hepatitis C.  Sexually transmitted infections (STIs)  You should be screened each year for STIs, including gonorrhea and chlamydia, if:  You are sexually active and are younger than 48 years of age.  You are older than 48 years of age and your health care provider tells you that you are at risk for this type of infection.  Your sexual activity has changed since you were last screened, and you are at increased risk for chlamydia or gonorrhea. Ask your health care provider if you are at risk.  Ask your health care provider about whether you are at high risk for HIV. Your health care provider  may recommend a prescription medicine to help prevent HIV infection. If you choose to take medicine to prevent HIV, you should first get tested for HIV. You should then be tested every 3 months for as long as you are taking the medicine.  Follow these instructions at home:  Alcohol use  Do not drink alcohol if your health care provider tells you not to drink.  If you drink alcohol:  Limit how much you have to 0-2 drinks a day.  Know how much alcohol is in your drink. In the [place] one drink equals one 12 oz bottle of beer (355 mL), one 5 oz glass of wine (148 mL), or one 1 oz glass of hard liquor (44 mL).  Lifestyle  Do not use any products that contain nicotine or tobacco. These products include cigarettes, chewing tobacco, and vaping devices, such as e-cigarettes. If you need help quitting, ask your health care provider.  Do not use street drugs.  Do not share needles.  Ask your health care provider for help if you need support or information about quitting drugs.  General instructions  Schedule regular health, dental, and eye exams.  Stay current with your vaccines.  Tell your health care provider if:  You often feel depressed.  You have ever been abused or do not feel safe at home.  Summary  Adopting a healthy lifestyle and getting preventive care are important in promoting health and wellness.  Follow your health care provider's instructions about healthy diet, exercising, and getting tested or screened for diseases.  Follow your health care provider's instructions on monitoring your cholesterol and blood pressure.  This information is not intended to replace advice given to you by your health care provider. Make sure you discuss any questions you have with your health care provider.  Document Revised: 09/28/2020 Document Reviewed: 09/28/2020  [employer]vier Patient Education  2024 [employer].

## 2024-05-14 LAB — COLOGUARD: COLOGUARD: NEGATIVE
